# Patient Record
Sex: Male | Born: 1975 | Race: White | Hispanic: No | Smoking: Current every day smoker
Health system: Southern US, Community
[De-identification: ages and names within clinical notes are randomized; demographics above are authoritative.]

## PROBLEM LIST (undated history)

## (undated) DIAGNOSIS — K859 Acute pancreatitis without necrosis or infection, unspecified: Secondary | ICD-10-CM

## (undated) DIAGNOSIS — G8929 Other chronic pain: Secondary | ICD-10-CM

## (undated) DIAGNOSIS — I10 Essential (primary) hypertension: Secondary | ICD-10-CM

## (undated) DIAGNOSIS — M549 Dorsalgia, unspecified: Secondary | ICD-10-CM

## (undated) DIAGNOSIS — N289 Disorder of kidney and ureter, unspecified: Secondary | ICD-10-CM

## (undated) HISTORY — PX: FACIAL RECONSTRUCTION SURGERY: SHX631

## (undated) HISTORY — PX: BACK SURGERY: SHX140

---

## 2008-12-29 ENCOUNTER — Emergency Department (HOSPITAL_COMMUNITY): Admission: EM | Admit: 2008-12-29 | Discharge: 2008-12-29 | Payer: Self-pay | Admitting: Emergency Medicine

## 2009-01-01 ENCOUNTER — Emergency Department: Payer: Self-pay | Admitting: Emergency Medicine

## 2009-01-04 ENCOUNTER — Emergency Department (HOSPITAL_COMMUNITY): Admission: EM | Admit: 2009-01-04 | Discharge: 2009-01-05 | Payer: Self-pay | Admitting: Emergency Medicine

## 2009-02-04 ENCOUNTER — Emergency Department (HOSPITAL_COMMUNITY): Admission: EM | Admit: 2009-02-04 | Discharge: 2009-02-04 | Payer: Self-pay | Admitting: Emergency Medicine

## 2009-09-04 ENCOUNTER — Emergency Department: Payer: Self-pay | Admitting: Emergency Medicine

## 2009-12-02 ENCOUNTER — Emergency Department (HOSPITAL_COMMUNITY): Admission: EM | Admit: 2009-12-02 | Discharge: 2009-12-02 | Payer: Self-pay | Admitting: Emergency Medicine

## 2010-04-16 ENCOUNTER — Emergency Department (HOSPITAL_COMMUNITY)
Admission: EM | Admit: 2010-04-16 | Discharge: 2010-04-17 | Disposition: A | Discharge status: Home / Self Care | Payer: Medicare Other | Attending: Emergency Medicine | Admitting: Emergency Medicine

## 2010-04-16 ENCOUNTER — Emergency Department (HOSPITAL_COMMUNITY): Payer: Medicare Other

## 2010-04-16 DIAGNOSIS — R1032 Left lower quadrant pain: Secondary | ICD-10-CM | POA: Insufficient documentation

## 2010-04-16 DIAGNOSIS — R319 Hematuria, unspecified: Secondary | ICD-10-CM | POA: Insufficient documentation

## 2010-04-17 ENCOUNTER — Emergency Department (HOSPITAL_COMMUNITY)
Admission: EM | Admit: 2010-04-17 | Discharge: 2010-04-17 | Disposition: A | Discharge status: Home / Self Care | Payer: Medicare Other | Attending: Emergency Medicine | Admitting: Emergency Medicine

## 2010-04-17 ENCOUNTER — Emergency Department (HOSPITAL_COMMUNITY): Payer: Medicare Other

## 2010-04-17 DIAGNOSIS — R319 Hematuria, unspecified: Secondary | ICD-10-CM | POA: Insufficient documentation

## 2010-04-17 DIAGNOSIS — R109 Unspecified abdominal pain: Secondary | ICD-10-CM | POA: Insufficient documentation

## 2010-04-17 DIAGNOSIS — R112 Nausea with vomiting, unspecified: Secondary | ICD-10-CM | POA: Insufficient documentation

## 2010-04-17 DIAGNOSIS — M549 Dorsalgia, unspecified: Secondary | ICD-10-CM | POA: Insufficient documentation

## 2010-04-17 LAB — URINALYSIS, ROUTINE W REFLEX MICROSCOPIC
Bilirubin Urine: NEGATIVE
Bilirubin Urine: NEGATIVE
Hgb urine dipstick: NEGATIVE
Nitrite: NEGATIVE
Protein, ur: NEGATIVE mg/dL
Specific Gravity, Urine: 1.025 (ref 1.005–1.030)
Urine Glucose, Fasting: NEGATIVE mg/dL
Urobilinogen, UA: 0.2 mg/dL (ref 0.0–1.0)
Urobilinogen, UA: 0.2 mg/dL (ref 0.0–1.0)
pH: 6 (ref 5.0–8.0)

## 2010-04-17 LAB — POCT I-STAT, CHEM 8
Creatinine, Ser: 1.2 mg/dL (ref 0.4–1.5)
Glucose, Bld: 95 mg/dL (ref 70–99)
HCT: 54 % — ABNORMAL HIGH (ref 39.0–52.0)
Hemoglobin: 18.4 g/dL — ABNORMAL HIGH (ref 13.0–17.0)
Sodium: 135 mEq/L (ref 135–145)
TCO2: 19 mmol/L (ref 0–100)

## 2010-04-17 LAB — BASIC METABOLIC PANEL
CO2: 21 mEq/L (ref 19–32)
Calcium: 8.9 mg/dL (ref 8.4–10.5)
Chloride: 106 mEq/L (ref 96–112)
GFR calc Af Amer: >60 mL/min (ref >60)
Glucose, Bld: 113 mg/dL — ABNORMAL HIGH (ref 70–99)
Potassium: 3.7 mEq/L (ref 3.5–5.1)
Sodium: 137 mEq/L (ref 135–145)

## 2010-04-17 LAB — DIFFERENTIAL
Basophils Absolute: 0 10*3/uL (ref 0.0–0.1)
Basophils Relative: 0 % (ref 0–1)
Lymphocytes Relative: 14 % (ref 12–46)
Monocytes Absolute: 0.4 10*3/uL (ref 0.1–1.0)
Neutro Abs: 4.1 10*3/uL (ref 1.7–7.7)
Neutrophils Relative %: 78 % — ABNORMAL HIGH (ref 43–77)

## 2010-04-17 LAB — CBC
HCT: 44.6 % (ref 39.0–52.0)
Hemoglobin: 15.4 g/dL (ref 13.0–17.0)
RBC: 5.12 MIL/uL (ref 4.22–5.81)
WBC: 5.3 10*3/uL (ref 4.0–10.5)

## 2010-04-17 LAB — URINE MICROSCOPIC-ADD ON

## 2010-05-26 LAB — DIFFERENTIAL
Basophils Absolute: 0 10*3/uL (ref 0.0–0.1)
Basophils Relative: 0 % (ref 0–1)
Eosinophils Absolute: 0.1 10*3/uL (ref 0.0–0.7)
Monocytes Relative: 7 % (ref 3–12)
Neutro Abs: 5.7 10*3/uL (ref 1.7–7.7)
Neutrophils Relative %: 73 % (ref 43–77)

## 2010-05-26 LAB — CBC
MCHC: 34 g/dL (ref 30.0–36.0)
Platelets: 220 10*3/uL (ref 150–400)
RDW: 13.3 % (ref 11.5–15.5)

## 2010-05-26 LAB — BASIC METABOLIC PANEL
CO2: 29 mEq/L (ref 19–32)
Calcium: 9.1 mg/dL (ref 8.4–10.5)
Creatinine, Ser: 1.04 mg/dL (ref 0.4–1.5)
Glucose, Bld: 86 mg/dL (ref 70–99)

## 2010-05-27 LAB — COMPREHENSIVE METABOLIC PANEL
ALT: 19 U/L (ref 0–53)
AST: 19 U/L (ref 0–37)
Albumin: 4.1 g/dL (ref 3.5–5.2)
Calcium: 8.9 mg/dL (ref 8.4–10.5)
Creatinine, Ser: 0.92 mg/dL (ref 0.4–1.5)
GFR calc Af Amer: >60 mL/min (ref >60)
Sodium: 139 mEq/L (ref 135–145)

## 2010-05-27 LAB — DIFFERENTIAL
Eosinophils Absolute: 0.1 10*3/uL (ref 0.0–0.7)
Eosinophils Relative: 1 % (ref 0–5)
Eosinophils Relative: 2 % (ref 0–5)
Lymphocytes Relative: 18 % (ref 12–46)
Lymphs Abs: 1.8 10*3/uL (ref 0.7–4.0)
Lymphs Abs: 2.2 10*3/uL (ref 0.7–4.0)
Monocytes Absolute: 0.8 10*3/uL (ref 0.1–1.0)
Monocytes Relative: 7 % (ref 3–12)
Monocytes Relative: 9 % (ref 3–12)

## 2010-05-27 LAB — URINE MICROSCOPIC-ADD ON

## 2010-05-27 LAB — RAPID URINE DRUG SCREEN, HOSP PERFORMED
Amphetamines: NOT DETECTED
Barbiturates: NOT DETECTED
Benzodiazepines: POSITIVE — AB
Opiates: POSITIVE — AB
Tetrahydrocannabinol: NOT DETECTED

## 2010-05-27 LAB — URINALYSIS, ROUTINE W REFLEX MICROSCOPIC
Bilirubin Urine: NEGATIVE
Glucose, UA: NEGATIVE mg/dL
Hgb urine dipstick: NEGATIVE
Ketones, ur: NEGATIVE mg/dL
Nitrite: NEGATIVE
Protein, ur: NEGATIVE mg/dL
Specific Gravity, Urine: 1.015 (ref 1.005–1.030)
Urobilinogen, UA: 0.2 mg/dL (ref 0.0–1.0)
pH: 6 (ref 5.0–8.0)

## 2010-05-27 LAB — CBC
HCT: 40.2 % (ref 39.0–52.0)
MCHC: 34.3 g/dL (ref 30.0–36.0)
MCV: 89.7 fL (ref 78.0–100.0)
MCV: 89.7 fL (ref 78.0–100.0)
Platelets: 286 10*3/uL (ref 150–400)
Platelets: 368 10*3/uL (ref 150–400)
WBC: 10.3 10*3/uL (ref 4.0–10.5)
WBC: 7.9 10*3/uL (ref 4.0–10.5)

## 2010-05-27 LAB — BASIC METABOLIC PANEL
BUN: 10 mg/dL (ref 6–23)
CO2: 30 mEq/L (ref 19–32)
Chloride: 106 mEq/L (ref 96–112)
Potassium: 3.2 mEq/L — ABNORMAL LOW (ref 3.5–5.1)

## 2010-05-27 LAB — LIPASE, BLOOD: Lipase: 93 U/L — ABNORMAL HIGH (ref 11–59)

## 2010-08-17 ENCOUNTER — Emergency Department (HOSPITAL_COMMUNITY): Payer: Medicare Other

## 2010-08-17 ENCOUNTER — Emergency Department (HOSPITAL_COMMUNITY)
Admission: EM | Admit: 2010-08-17 | Discharge: 2010-08-17 | Disposition: A | Discharge status: Home / Self Care | Payer: Medicare Other | Attending: Emergency Medicine | Admitting: Emergency Medicine

## 2010-08-17 DIAGNOSIS — N200 Calculus of kidney: Secondary | ICD-10-CM | POA: Insufficient documentation

## 2010-08-17 DIAGNOSIS — R1031 Right lower quadrant pain: Secondary | ICD-10-CM | POA: Insufficient documentation

## 2010-08-17 DIAGNOSIS — Z79899 Other long term (current) drug therapy: Secondary | ICD-10-CM | POA: Insufficient documentation

## 2010-08-17 DIAGNOSIS — R630 Anorexia: Secondary | ICD-10-CM | POA: Insufficient documentation

## 2010-08-17 LAB — BASIC METABOLIC PANEL
Calcium: 9.7 mg/dL (ref 8.4–10.5)
Creatinine, Ser: 0.83 mg/dL (ref 0.50–1.35)
GFR calc Af Amer: >60 mL/min (ref >60)

## 2010-08-17 LAB — CBC
MCH: 29.3 pg (ref 26.0–34.0)
MCHC: 33.7 g/dL (ref 30.0–36.0)
MCV: 87 fL (ref 78.0–100.0)
Platelets: 230 10*3/uL (ref 150–400)
RDW: 13.4 % (ref 11.5–15.5)

## 2010-08-17 LAB — URINALYSIS, ROUTINE W REFLEX MICROSCOPIC
Bilirubin Urine: NEGATIVE
Nitrite: NEGATIVE
Protein, ur: NEGATIVE mg/dL
Specific Gravity, Urine: 1.025 (ref 1.005–1.030)
Urobilinogen, UA: 0.2 mg/dL (ref 0.0–1.0)

## 2010-08-17 LAB — DIFFERENTIAL
Eosinophils Absolute: 0 10*3/uL (ref 0.0–0.7)
Eosinophils Relative: 0 % (ref 0–5)
Lymphs Abs: 0.7 10*3/uL (ref 0.7–4.0)
Monocytes Relative: 9 % (ref 3–12)

## 2010-08-17 LAB — HEPATIC FUNCTION PANEL
Alkaline Phosphatase: 87 U/L (ref 39–117)
Indirect Bilirubin: 0.2 mg/dL — ABNORMAL LOW (ref 0.3–0.9)
Total Protein: 7.3 g/dL (ref 6.0–8.3)

## 2010-08-17 LAB — LIPASE, BLOOD: Lipase: 54 U/L (ref 11–59)

## 2010-08-17 MED ORDER — IOHEXOL 300 MG/ML  SOLN
100.0000 mL | Freq: Once | INTRAMUSCULAR | Status: AC | PRN
  Administered 2010-08-17: 100 mL via INTRAVENOUS

## 2012-07-20 ENCOUNTER — Emergency Department (HOSPITAL_COMMUNITY)
Admission: EM | Admit: 2012-07-20 | Discharge: 2012-07-20 | Disposition: A | Discharge status: Home / Self Care | Payer: Medicare Other | Attending: Emergency Medicine | Admitting: Emergency Medicine

## 2012-07-20 ENCOUNTER — Encounter (HOSPITAL_COMMUNITY): Payer: Self-pay | Admitting: *Deleted

## 2012-07-20 DIAGNOSIS — M549 Dorsalgia, unspecified: Secondary | ICD-10-CM | POA: Insufficient documentation

## 2012-07-20 DIAGNOSIS — R112 Nausea with vomiting, unspecified: Secondary | ICD-10-CM | POA: Insufficient documentation

## 2012-07-20 DIAGNOSIS — I10 Essential (primary) hypertension: Secondary | ICD-10-CM | POA: Insufficient documentation

## 2012-07-20 DIAGNOSIS — N200 Calculus of kidney: Secondary | ICD-10-CM | POA: Diagnosis present

## 2012-07-20 DIAGNOSIS — Z79899 Other long term (current) drug therapy: Secondary | ICD-10-CM | POA: Insufficient documentation

## 2012-07-20 DIAGNOSIS — R109 Unspecified abdominal pain: Secondary | ICD-10-CM | POA: Insufficient documentation

## 2012-07-20 DIAGNOSIS — R319 Hematuria, unspecified: Secondary | ICD-10-CM | POA: Insufficient documentation

## 2012-07-20 DIAGNOSIS — Z8719 Personal history of other diseases of the digestive system: Secondary | ICD-10-CM | POA: Insufficient documentation

## 2012-07-20 DIAGNOSIS — R197 Diarrhea, unspecified: Secondary | ICD-10-CM | POA: Insufficient documentation

## 2012-07-20 DIAGNOSIS — Z87442 Personal history of urinary calculi: Secondary | ICD-10-CM | POA: Insufficient documentation

## 2012-07-20 DIAGNOSIS — G8929 Other chronic pain: Secondary | ICD-10-CM | POA: Insufficient documentation

## 2012-07-20 DIAGNOSIS — Z87828 Personal history of other (healed) physical injury and trauma: Secondary | ICD-10-CM | POA: Insufficient documentation

## 2012-07-20 DIAGNOSIS — R34 Anuria and oliguria: Secondary | ICD-10-CM | POA: Insufficient documentation

## 2012-07-20 DIAGNOSIS — R61 Generalized hyperhidrosis: Secondary | ICD-10-CM | POA: Insufficient documentation

## 2012-07-20 HISTORY — DX: Essential (primary) hypertension: I10

## 2012-07-20 HISTORY — DX: Disorder of kidney and ureter, unspecified: N28.9

## 2012-07-20 HISTORY — DX: Acute pancreatitis without necrosis or infection, unspecified: K85.90

## 2012-07-20 HISTORY — DX: Other chronic pain: G89.29

## 2012-07-20 HISTORY — DX: Dorsalgia, unspecified: M54.9

## 2012-07-20 LAB — CBC
MCH: 30.4 pg (ref 26.0–34.0)
MCHC: 34.9 g/dL (ref 30.0–36.0)
MCV: 87.1 fL (ref 78.0–100.0)
Platelets: 343 10*3/uL (ref 150–400)
RDW: 14.4 % (ref 11.5–15.5)
WBC: 6.4 10*3/uL (ref 4.0–10.5)

## 2012-07-20 LAB — COMPREHENSIVE METABOLIC PANEL
ALT: 22 U/L (ref 0–53)
CO2: 27 mEq/L (ref 19–32)
Calcium: 9.9 mg/dL (ref 8.4–10.5)
Chloride: 103 mEq/L (ref 96–112)
GFR calc Af Amer: >90 mL/min (ref >90)
GFR calc non Af Amer: >90 mL/min (ref >90)
Glucose, Bld: 92 mg/dL (ref 70–99)
Sodium: 141 mEq/L (ref 135–145)
Total Bilirubin: 0.6 mg/dL (ref 0.3–1.2)

## 2012-07-20 LAB — URINALYSIS, ROUTINE W REFLEX MICROSCOPIC
Ketones, ur: NEGATIVE mg/dL
Protein, ur: NEGATIVE mg/dL
Urobilinogen, UA: 0.2 mg/dL (ref 0.0–1.0)

## 2012-07-20 LAB — URINE MICROSCOPIC-ADD ON

## 2012-07-20 MED ORDER — SODIUM CHLORIDE 0.9 % IV BOLUS (SEPSIS)
1000.0000 mL | Freq: Once | INTRAVENOUS | Status: AC
  Administered 2012-07-20: 1000 mL via INTRAVENOUS

## 2012-07-20 MED ORDER — ONDANSETRON 4 MG PO TBDP: 4.0000 mg | ORAL_TABLET | Freq: Three times a day (TID) | ORAL | Status: DC | PRN

## 2012-07-20 MED ORDER — ONDANSETRON HCL 4 MG/2ML IJ SOLN: 4.0000 mg | Freq: Once | INTRAMUSCULAR | Status: DC

## 2012-07-20 MED ORDER — NAPROXEN SODIUM 220 MG PO TABS: 440.0000 mg | ORAL_TABLET | Freq: Two times a day (BID) | ORAL | Status: DC

## 2012-07-20 MED ORDER — PROMETHAZINE HCL 25 MG/ML IJ SOLN
12.5000 mg | Freq: Once | INTRAMUSCULAR | Status: AC
  Administered 2012-07-20: 12.5 mg via INTRAVENOUS
  Filled 2012-07-20: qty 1

## 2012-07-20 MED ORDER — HYDROMORPHONE HCL PF 1 MG/ML IJ SOLN
1.0000 mg | Freq: Once | INTRAMUSCULAR | Status: AC
  Administered 2012-07-20: 1 mg via INTRAVENOUS
  Filled 2012-07-20: qty 1

## 2012-07-20 MED ORDER — DIPHENHYDRAMINE HCL 50 MG/ML IJ SOLN
12.5000 mg | Freq: Once | INTRAMUSCULAR | Status: AC
  Administered 2012-07-20: 12.5 mg via INTRAVENOUS
  Filled 2012-07-20: qty 1

## 2012-07-20 NOTE — ED Notes (Signed)
Left flank pain radiating to back with dark urine and burning with urination, n/v since las night.

## 2012-07-20 NOTE — ED Notes (Signed)
Patient able to urinate in urinal. Refused in and out.
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
## 2012-07-21 NOTE — ED Provider Notes (Addendum)
I have personally seen and examined the patient.  I have discussed the plan of care with the resident.  I have reviewed the documentation on PMH/FH/Soc. History.  I have reviewed the documentation of the resident and agree.  Pt well appearing, no distress on my evaluation, abdomen soft without focal tenderness.  Stable for d/c [facility] [place] reviewed  Joya Gaskins, MD 07/21/12 [phone]  Please note that patient is 37 year old, not 37 year old  Joya Gaskins, MD 07/21/12 [phone]

## 2012-07-30 ENCOUNTER — Encounter (HOSPITAL_BASED_OUTPATIENT_CLINIC_OR_DEPARTMENT_OTHER): Payer: Self-pay

## 2013-11-28 ENCOUNTER — Emergency Department (HOSPITAL_COMMUNITY)
Admission: EM | Admit: 2013-11-28 | Discharge: 2013-11-28 | Disposition: A | Discharge status: Home / Self Care | Payer: Medicare Other | Attending: Emergency Medicine | Admitting: Emergency Medicine

## 2013-11-28 ENCOUNTER — Emergency Department (HOSPITAL_COMMUNITY): Payer: Medicare Other

## 2013-11-28 ENCOUNTER — Encounter (HOSPITAL_COMMUNITY): Payer: Self-pay | Admitting: Emergency Medicine

## 2013-11-28 DIAGNOSIS — R062 Wheezing: Secondary | ICD-10-CM | POA: Insufficient documentation

## 2013-11-28 DIAGNOSIS — M549 Dorsalgia, unspecified: Secondary | ICD-10-CM | POA: Diagnosis not present

## 2013-11-28 DIAGNOSIS — G8929 Other chronic pain: Secondary | ICD-10-CM | POA: Insufficient documentation

## 2013-11-28 DIAGNOSIS — Z79899 Other long term (current) drug therapy: Secondary | ICD-10-CM | POA: Insufficient documentation

## 2013-11-28 DIAGNOSIS — R319 Hematuria, unspecified: Secondary | ICD-10-CM | POA: Insufficient documentation

## 2013-11-28 DIAGNOSIS — Z87828 Personal history of other (healed) physical injury and trauma: Secondary | ICD-10-CM | POA: Diagnosis not present

## 2013-11-28 DIAGNOSIS — R109 Unspecified abdominal pain: Secondary | ICD-10-CM

## 2013-11-28 DIAGNOSIS — I10 Essential (primary) hypertension: Secondary | ICD-10-CM | POA: Insufficient documentation

## 2013-11-28 DIAGNOSIS — Z87448 Personal history of other diseases of urinary system: Secondary | ICD-10-CM | POA: Insufficient documentation

## 2013-11-28 DIAGNOSIS — R339 Retention of urine, unspecified: Secondary | ICD-10-CM | POA: Diagnosis present

## 2013-11-28 LAB — CBC WITH DIFFERENTIAL/PLATELET
BASOS ABS: 0 10*3/uL (ref 0.0–0.1)
BASOS PCT: 0 % (ref 0–1)
EOS ABS: 0.1 10*3/uL (ref 0.0–0.7)
Eosinophils Relative: 2 % (ref 0–5)
HCT: 40.4 % (ref 39.0–52.0)
Hemoglobin: 13.8 g/dL (ref 13.0–17.0)
Lymphocytes Relative: 45 % (ref 12–46)
Lymphs Abs: 3.1 10*3/uL (ref 0.7–4.0)
MCH: 30.3 pg (ref 26.0–34.0)
MCHC: 34.2 g/dL (ref 30.0–36.0)
MCV: 88.8 fL (ref 78.0–100.0)
Monocytes Absolute: 0.4 10*3/uL (ref 0.1–1.0)
Monocytes Relative: 6 % (ref 3–12)
NEUTROS PCT: 47 % (ref 43–77)
Neutro Abs: 3.2 10*3/uL (ref 1.7–7.7)
PLATELETS: 320 10*3/uL (ref 150–400)
RBC: 4.55 MIL/uL (ref 4.22–5.81)
RDW: 13.5 % (ref 11.5–15.5)
WBC: 6.8 10*3/uL (ref 4.0–10.5)

## 2013-11-28 LAB — BASIC METABOLIC PANEL
ANION GAP: 15 (ref 5–15)
BUN: 9 mg/dL (ref 6–23)
CO2: 25 mEq/L (ref 19–32)
Calcium: 9.1 mg/dL (ref 8.4–10.5)
Chloride: 103 mEq/L (ref 96–112)
Creatinine, Ser: 1.16 mg/dL (ref 0.50–1.35)
GFR, EST NON AFRICAN AMERICAN: 78 mL/min — AB (ref >90)
Glucose, Bld: 89 mg/dL (ref 70–99)
POTASSIUM: 3.6 meq/L — AB (ref 3.7–5.3)
SODIUM: 143 meq/L (ref 137–147)

## 2013-11-28 LAB — URINALYSIS, ROUTINE W REFLEX MICROSCOPIC
BILIRUBIN URINE: NEGATIVE
Glucose, UA: NEGATIVE mg/dL
KETONES UR: NEGATIVE mg/dL
LEUKOCYTES UA: NEGATIVE
NITRITE: NEGATIVE
Protein, ur: NEGATIVE mg/dL
Specific Gravity, Urine: 1.01 (ref 1.005–1.030)
UROBILINOGEN UA: 0.2 mg/dL (ref 0.0–1.0)
pH: 6 (ref 5.0–8.0)

## 2013-11-28 LAB — URINE MICROSCOPIC-ADD ON

## 2013-11-28 MED ORDER — NAPROXEN 500 MG PO TABS: 500.0000 mg | ORAL_TABLET | Freq: Two times a day (BID) | ORAL | Status: DC

## 2013-11-28 MED ORDER — TRAMADOL HCL 50 MG PO TABS: 50.0000 mg | ORAL_TABLET | Freq: Four times a day (QID) | ORAL | Status: DC | PRN

## 2013-11-28 MED ORDER — SODIUM CHLORIDE 0.9 % IV SOLN: INTRAVENOUS | Status: DC

## 2013-11-28 MED ORDER — CEPHALEXIN 500 MG PO CAPS: 500.0000 mg | ORAL_CAPSULE | Freq: Four times a day (QID) | ORAL | Status: DC

## 2013-11-28 MED ORDER — PROMETHAZINE HCL 25 MG PO TABS: 25.0000 mg | ORAL_TABLET | Freq: Four times a day (QID) | ORAL | Status: DC | PRN

## 2013-11-28 MED ORDER — HYDROMORPHONE HCL 1 MG/ML IJ SOLN
1.0000 mg | Freq: Once | INTRAMUSCULAR | Status: AC
  Administered 2013-11-28: 1 mg via INTRAVENOUS
  Filled 2013-11-28: qty 1

## 2013-11-28 MED ORDER — ONDANSETRON HCL 4 MG/2ML IJ SOLN
4.0000 mg | Freq: Once | INTRAMUSCULAR | Status: AC
  Administered 2013-11-28: 4 mg via INTRAVENOUS
  Filled 2013-11-28: qty 2

## 2013-11-28 MED ORDER — SODIUM CHLORIDE 0.9 % IV BOLUS (SEPSIS)
1000.0000 mL | Freq: Once | INTRAVENOUS | Status: AC
  Administered 2013-11-28: 1000 mL via INTRAVENOUS
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
## 2013-11-28 NOTE — ED Notes (Signed)
Doubled over, holding left groin, states pain 10/10 and nausea persists after taking phenergan @ home. States he's passed 3 stones @ home and now is having persistent pain and scrotal swelling

## 2013-11-28 NOTE — ED Notes (Signed)
Complain of pain in left flank area. States he has to sit down to pee.History of stones

## 2013-11-28 NOTE — Discharge Instructions (Signed)
Workup for the flank pain without good explanation based on CT scan. Certainly has hematuria but no evidence of any ureteral stones. Urine culture sent we'll treat as possible infection. Take antibiotic as directed. Take antinausea medicine as needed. Take pain medicine as needed. Followup with your doctor in the next few days. Return for a newer worse symptoms.

## 2013-11-29 LAB — URINE CULTURE
COLONY COUNT: NO GROWTH
CULTURE: NO GROWTH

## 2014-03-15 ENCOUNTER — Ambulatory Visit: Payer: Medicare Other | Admitting: Urology

## 2014-06-03 ENCOUNTER — Emergency Department: Admit: 2014-06-03 | Disposition: A | Discharge status: Home / Self Care | Payer: Self-pay | Admitting: Student

## 2014-06-03 LAB — COMPREHENSIVE METABOLIC PANEL
ALK PHOS: 74 U/L
AST: 19 U/L
Albumin: 5 g/dL
Anion Gap: 8 (ref 7–16)
BUN: 9 mg/dL
Bilirubin,Total: 0.6 mg/dL
CALCIUM: 9.6 mg/dL
CO2: 30 mmol/L
Chloride: 106 mmol/L
Creatinine: 1.09 mg/dL
EGFR (African American): >60
EGFR (Non-African Amer.): >60
Glucose: 80 mg/dL
POTASSIUM: 3.9 mmol/L
SGPT (ALT): 16 U/L — ABNORMAL LOW
Sodium: 144 mmol/L
Total Protein: 8.2 g/dL — ABNORMAL HIGH

## 2014-06-03 LAB — DRUG SCREEN, URINE
Amphetamines, Ur Screen: NEGATIVE
BARBITURATES, UR SCREEN: NEGATIVE
Benzodiazepine, Ur Scrn: NEGATIVE
CANNABINOID 50 NG, UR ~~LOC~~: POSITIVE
COCAINE METABOLITE, UR ~~LOC~~: POSITIVE
MDMA (ECSTASY) UR SCREEN: NEGATIVE
Methadone, Ur Screen: NEGATIVE
Opiate, Ur Screen: NEGATIVE
Phencyclidine (PCP) Ur S: NEGATIVE
Tricyclic, Ur Screen: NEGATIVE

## 2014-06-03 LAB — CBC
HCT: 46.2 % (ref 40.0–52.0)
HGB: 15.6 g/dL (ref 13.0–18.0)
MCH: 30 pg (ref 26.0–34.0)
MCHC: 33.8 g/dL (ref 32.0–36.0)
MCV: 89 fL (ref 80–100)
Platelet: 295 10*3/uL (ref 150–440)
RBC: 5.21 10*6/uL (ref 4.40–5.90)
RDW: 14.3 % (ref 11.5–14.5)
WBC: 6.3 10*3/uL (ref 3.8–10.6)

## 2014-06-03 LAB — URINALYSIS, COMPLETE
Bacteria: NONE SEEN
Bilirubin,UR: NEGATIVE
Blood: NEGATIVE
GLUCOSE, UR: NEGATIVE mg/dL (ref 0–75)
Ketone: NEGATIVE
LEUKOCYTE ESTERASE: NEGATIVE
NITRITE: NEGATIVE
PROTEIN: NEGATIVE
Ph: 6 (ref 4.5–8.0)
Specific Gravity: 1.018 (ref 1.003–1.030)
Squamous Epithelial: NONE SEEN

## 2014-06-03 LAB — ETHANOL

## 2015-06-02 ENCOUNTER — Encounter: Payer: Self-pay | Admitting: Urology

## 2015-06-02 ENCOUNTER — Ambulatory Visit: Payer: Self-pay | Admitting: Urology

## 2015-07-30 ENCOUNTER — Ambulatory Visit: Payer: Medicare Other | Attending: Anesthesiology | Admitting: Anesthesiology

## 2015-07-30 ENCOUNTER — Encounter: Payer: Self-pay | Admitting: Anesthesiology

## 2015-07-30 ENCOUNTER — Ambulatory Visit: Payer: Medicare Other | Admitting: Anesthesiology

## 2015-07-30 VITALS — BP 196/125 | HR 48 | Temp 97.6°F | Resp 16 | Ht 72.0 in | Wt 210.0 lb

## 2015-07-30 DIAGNOSIS — Z87442 Personal history of urinary calculi: Secondary | ICD-10-CM | POA: Insufficient documentation

## 2015-07-30 DIAGNOSIS — M542 Cervicalgia: Secondary | ICD-10-CM | POA: Diagnosis present

## 2015-07-30 DIAGNOSIS — F419 Anxiety disorder, unspecified: Secondary | ICD-10-CM | POA: Insufficient documentation

## 2015-07-30 DIAGNOSIS — M5136 Other intervertebral disc degeneration, lumbar region: Secondary | ICD-10-CM | POA: Diagnosis not present

## 2015-07-30 DIAGNOSIS — M549 Dorsalgia, unspecified: Secondary | ICD-10-CM | POA: Diagnosis present

## 2015-07-30 DIAGNOSIS — M47817 Spondylosis without myelopathy or radiculopathy, lumbosacral region: Secondary | ICD-10-CM | POA: Diagnosis not present

## 2015-07-30 DIAGNOSIS — M25569 Pain in unspecified knee: Secondary | ICD-10-CM | POA: Diagnosis present

## 2015-07-30 DIAGNOSIS — I151 Hypertension secondary to other renal disorders: Secondary | ICD-10-CM | POA: Insufficient documentation

## 2015-07-30 DIAGNOSIS — G8929 Other chronic pain: Secondary | ICD-10-CM

## 2015-07-30 DIAGNOSIS — R5383 Other fatigue: Secondary | ICD-10-CM | POA: Diagnosis not present

## 2015-07-30 DIAGNOSIS — F17210 Nicotine dependence, cigarettes, uncomplicated: Secondary | ICD-10-CM | POA: Insufficient documentation
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
## 2015-08-04 ENCOUNTER — Other Ambulatory Visit: Payer: Self-pay

## 2015-08-08 LAB — TOXASSURE SELECT 13 (MW), URINE: PDF: 0

## 2015-09-03 ENCOUNTER — Ambulatory Visit: Payer: Medicare Other | Admitting: Anesthesiology

## 2015-09-04 ENCOUNTER — Telehealth: Payer: Self-pay

## 2015-09-04 NOTE — Telephone Encounter (Signed)
Spoke with patient, he would like to keep his appt for the procedure on July 18 and would like to cancel appt for MRI on Saturday and reschedule for a later date.

## 2015-09-04 NOTE — Telephone Encounter (Signed)
Pt wants to know does he still have to come in for appt on procedure even though he has not had his MRI. Pt had an MRI scheduled for Saturday but wants me to cancel it because he does not have a ride.

## 2015-09-06 ENCOUNTER — Ambulatory Visit: Payer: Medicare Other

## 2015-09-09 ENCOUNTER — Emergency Department
Admission: EM | Admit: 2015-09-09 | Discharge: 2015-09-09 | Disposition: A | Discharge status: Home / Self Care | Payer: Medicare Other | Attending: Emergency Medicine | Admitting: Emergency Medicine

## 2015-09-09 ENCOUNTER — Encounter: Payer: Self-pay | Admitting: Anesthesiology

## 2015-09-09 ENCOUNTER — Ambulatory Visit (HOSPITAL_BASED_OUTPATIENT_CLINIC_OR_DEPARTMENT_OTHER): Payer: Medicare Other | Admitting: Anesthesiology

## 2015-09-09 DIAGNOSIS — F17210 Nicotine dependence, cigarettes, uncomplicated: Secondary | ICD-10-CM

## 2015-09-09 DIAGNOSIS — M545 Low back pain: Secondary | ICD-10-CM

## 2015-09-09 DIAGNOSIS — M5136 Other intervertebral disc degeneration, lumbar region: Secondary | ICD-10-CM | POA: Insufficient documentation

## 2015-09-09 DIAGNOSIS — Z87442 Personal history of urinary calculi: Secondary | ICD-10-CM | POA: Insufficient documentation

## 2015-09-09 DIAGNOSIS — K859 Acute pancreatitis without necrosis or infection, unspecified: Secondary | ICD-10-CM

## 2015-09-09 DIAGNOSIS — N200 Calculus of kidney: Secondary | ICD-10-CM | POA: Diagnosis not present

## 2015-09-09 DIAGNOSIS — F419 Anxiety disorder, unspecified: Secondary | ICD-10-CM | POA: Insufficient documentation

## 2015-09-09 DIAGNOSIS — I151 Hypertension secondary to other renal disorders: Secondary | ICD-10-CM

## 2015-09-09 DIAGNOSIS — G8929 Other chronic pain: Secondary | ICD-10-CM

## 2015-09-09 DIAGNOSIS — M47817 Spondylosis without myelopathy or radiculopathy, lumbosacral region: Secondary | ICD-10-CM | POA: Diagnosis not present

## 2015-09-09 DIAGNOSIS — I10 Essential (primary) hypertension: Secondary | ICD-10-CM | POA: Insufficient documentation

## 2015-09-09 DIAGNOSIS — Z79899 Other long term (current) drug therapy: Secondary | ICD-10-CM | POA: Diagnosis not present

## 2015-09-09 DIAGNOSIS — R202 Paresthesia of skin: Secondary | ICD-10-CM | POA: Insufficient documentation

## 2015-09-09 DIAGNOSIS — R1030 Lower abdominal pain, unspecified: Secondary | ICD-10-CM | POA: Diagnosis present

## 2015-09-09 LAB — BASIC METABOLIC PANEL
Anion gap: 4 — ABNORMAL LOW (ref 5–15)
BUN: 7 mg/dL (ref 6–20)
CALCIUM: 9 mg/dL (ref 8.9–10.3)
CO2: 28 mmol/L (ref 22–32)
Chloride: 108 mmol/L (ref 101–111)
Creatinine, Ser: 0.89 mg/dL (ref 0.61–1.24)
GFR calc Af Amer: >60 mL/min (ref >60)
GFR calc non Af Amer: >60 mL/min (ref >60)
GLUCOSE: 90 mg/dL (ref 65–99)
Potassium: 3.8 mmol/L (ref 3.5–5.1)
Sodium: 140 mmol/L (ref 135–145)

## 2015-09-09 LAB — CBC
HCT: 40.1 % (ref 40.0–52.0)
Hemoglobin: 13.9 g/dL (ref 13.0–18.0)
MCH: 30.5 pg (ref 26.0–34.0)
MCHC: 34.6 g/dL (ref 32.0–36.0)
MCV: 88 fL (ref 80.0–100.0)
PLATELETS: 292 10*3/uL (ref 150–440)
RBC: 4.55 MIL/uL (ref 4.40–5.90)
RDW: 14.1 % (ref 11.5–14.5)
WBC: 6.2 10*3/uL (ref 3.8–10.6)

## 2015-09-09 LAB — URINALYSIS COMPLETE WITH MICROSCOPIC (ARMC ONLY)
BILIRUBIN URINE: NEGATIVE
Bacteria, UA: NONE SEEN
Glucose, UA: NEGATIVE mg/dL
KETONES UR: NEGATIVE mg/dL
Leukocytes, UA: NEGATIVE
NITRITE: NEGATIVE
PH: 5 (ref 5.0–8.0)
Protein, ur: NEGATIVE mg/dL
SPECIFIC GRAVITY, URINE: 1.014 (ref 1.005–1.030)
SQUAMOUS EPITHELIAL / LPF: NONE SEEN

## 2015-09-09 MED ORDER — OXYCODONE-ACETAMINOPHEN 5-325 MG PO TABS
1.0000 | ORAL_TABLET | Freq: Once | ORAL | Status: AC
  Administered 2015-09-09: 1 via ORAL

## 2015-09-09 MED ORDER — SODIUM CHLORIDE 0.9 % IV BOLUS (SEPSIS)
1000.0000 mL | Freq: Once | INTRAVENOUS | Status: AC
  Administered 2015-09-09: 1000 mL via INTRAVENOUS

## 2015-09-09 MED ORDER — KETOROLAC TROMETHAMINE 30 MG/ML IJ SOLN
30.0000 mg | Freq: Once | INTRAMUSCULAR | Status: AC
  Administered 2015-09-09: 30 mg via INTRAVENOUS
  Filled 2015-09-09: qty 1

## 2015-09-09 MED ORDER — TAMSULOSIN HCL 0.4 MG PO CAPS: 0.4000 mg | ORAL_CAPSULE | Freq: Every day | ORAL | Status: DC

## 2015-09-09 MED ORDER — ONDANSETRON HCL 4 MG PO TABS: 4.0000 mg | ORAL_TABLET | Freq: Three times a day (TID) | ORAL | Status: DC | PRN

## 2015-09-09 MED ORDER — KETOROLAC TROMETHAMINE 10 MG PO TABS: 10.0000 mg | ORAL_TABLET | Freq: Three times a day (TID) | ORAL | Status: DC | PRN

## 2015-09-09 MED ORDER — OXYCODONE-ACETAMINOPHEN 5-325 MG PO TABS
ORAL_TABLET | ORAL | Status: AC
  Administered 2015-09-09: 1 via ORAL
  Filled 2015-09-09: qty 1

## 2015-09-09 NOTE — Patient Instructions (Signed)
Pain Management Discharge Instructions  General Discharge Instructions :  If you need to reach your doctor call: Monday-Friday 8:00 am - 4:00 pm at [phone] or toll free [phone].  After clinic hours [phone] to have operator reach doctor.  Bring all of your medication bottles to all your appointments in the pain clinic.  To cancel or reschedule your appointment with Pain Management please remember to call 24 hours in advance to avoid a fee.  Refer to the educational materials which you have been given on: General Risks, I had my Procedure. Discharge Instructions, Post Sedation.  Post Procedure Instructions:  The drugs you were given will stay in your system until tomorrow, so for the next 24 hours you should not drive, make any legal decisions or drink any alcoholic beverages.  You may eat anything you prefer, but it is better to start with liquids then soups and crackers, and gradually work up to solid foods.  Please notify your doctor immediately if you have any unusual bleeding, trouble breathing or pain that is not related to your normal pain.  Depending on the type of procedure that was done, some parts of your body may feel week and/or numb.  This usually clears up by tonight or the next day.  Walk with the use of an assistive device or accompanied by an adult for the 24 hours.  You may use ice on the affected area for the first 24 hours.  Put ice in a Ziploc bag and cover with a towel and place against area 15 minutes on 15 minutes off.  You may switch to heat after 24 hours.GENERAL RISKS AND COMPLICATIONS  What are the risk, side effects and possible complications? Generally speaking, most procedures are safe.  However, with any procedure there are risks, side effects, and the possibility of complications.  The risks and complications are dependent upon the sites that are lesioned, or the type of nerve block to be performed.  The closer the procedure is to the spine,  the more serious the risks are.  Great care is taken when placing the radio frequency needles, block needles or lesioning probes, but sometimes complications can occur. 1. Infection: Any time there is an injection through the skin, there is a risk of infection.  This is why sterile conditions are used for these blocks.  There are four possible types of infection. 1. Localized skin infection. 2. Central Nervous System Infection-This can be in the form of Meningitis, which can be deadly. 3. Epidural Infections-This can be in the form of an epidural abscess, which can cause pressure inside of the spine, causing compression of the spinal cord with subsequent paralysis. This would require an emergency surgery to decompress, and there are no guarantees that the patient would recover from the paralysis. 4. Discitis-This is an infection of the intervertebral discs.  It occurs in about 1% of discography procedures.  It is difficult to treat and it may lead to surgery.        2. Pain: the needles have to go through skin and soft tissues, will cause soreness.       3. Damage to internal structures:  The nerves to be lesioned may be near blood vessels or    other nerves which can be potentially damaged.       4. Bleeding: Bleeding is more common if the patient is taking blood thinners such as  aspirin, Coumadin, Ticiid, Plavix, etc., or if he/she have some genetic predisposition  such as   hemophilia. Bleeding into the spinal canal can cause compression of the spinal  cord with subsequent paralysis.  This would require an emergency surgery to  decompress and there are no guarantees that the patient would recover from the  paralysis.       5. Pneumothorax:  Puncturing of a lung is a possibility, every time a needle is introduced in  the area of the chest or upper back.  Pneumothorax refers to free air around the  collapsed lung(s), inside of the thoracic cavity (chest cavity).  Another two possible  complications  related to a similar event would include: Hemothorax and Chylothorax.   These are variations of the Pneumothorax, where instead of air around the collapsed  lung(s), you may have blood or chyle, respectively.       6. Spinal headaches: They may occur with any procedures in the area of the spine.       7. Persistent CSF (Cerebro-Spinal Fluid) leakage: This is a rare problem, but may occur  with prolonged intrathecal or epidural catheters either due to the formation of a fistulous  track or a dural tear.       8. Nerve damage: By working so close to the spinal cord, there is always a possibility of  nerve damage, which could be as serious as a permanent spinal cord injury with  paralysis.       9. Death:  Although rare, severe deadly allergic reactions known as "Anaphylactic  reaction" can occur to any of the medications used.      10. Worsening of the symptoms:  We can always make thing worse.  What are the chances of something like this happening? Chances of any of this occuring are extremely low.  By statistics, you have more of a chance of getting killed in a motor vehicle accident: while driving to the hospital than any of the above occurring .  Nevertheless, you should be aware that they are possibilities.  In general, it is similar to taking a shower.  Everybody knows that you can slip, hit your head and get killed.  Does that mean that you should not shower again?  Nevertheless always keep in mind that statistics do not mean anything if you happen to be on the wrong side of them.  Even if a procedure has a 1 (one) in a 1,000,000 (million) chance of going wrong, it you happen to be that one..Also, keep in mind that by statistics, you have more of a chance of having something go wrong when taking medications.  Who should not have this procedure? If you are on a blood thinning medication (e.g. Coumadin, Plavix, see list of "Blood Thinners"), or if you have an active infection going on, you should not  have the procedure.  If you are taking any blood thinners, please inform your physician.  How should I prepare for this procedure?  Do not eat or drink anything at least six hours prior to the procedure.  Bring a driver with you .  It cannot be a taxi.  Come accompanied by an adult that can drive you back, and that is strong enough to help you if your legs get weak or numb from the local anesthetic.  Take all of your medicines the morning of the procedure with just enough water to swallow them.  If you have diabetes, make sure that you are scheduled to have your procedure done first thing in the morning, whenever possible.  If you have diabetes,   take only half of your insulin dose and notify our nurse that you have done so as soon as you arrive at the clinic.  If you are diabetic, but only take blood sugar pills (oral hypoglycemic), then do not take them on the morning of your procedure.  You may take them after you have had the procedure.  Do not take aspirin or any aspirin-containing medications, at least eleven (11) days prior to the procedure.  They may prolong bleeding.  Wear loose fitting clothing that may be easy to take off and that you would not mind if it got stained with Betadine or blood.  Do not wear any jewelry or perfume  Remove any nail coloring.  It will interfere with some of our monitoring equipment.  NOTE: Remember that this is not meant to be interpreted as a complete list of all possible complications.  Unforeseen problems may occur.  BLOOD THINNERS The following drugs contain aspirin or other products, which can cause increased bleeding during surgery and should not be taken for 2 weeks prior to and 1 week after surgery.  If you should need take something for relief of minor pain, you may take acetaminophen which is found in Tylenol,m Datril, Anacin-3 and Panadol. It is not blood thinner. The products listed below are.  Do not take any of the products listed below  in addition to any listed on your instruction sheet.  A.P.C or A.P.C with Codeine Codeine Phosphate Capsules #3 Ibuprofen Ridaura  ABC compound Congesprin Imuran rimadil  Advil Cope Indocin Robaxisal  Alka-Seltzer Effervescent Pain Reliever and Antacid Coricidin or Coricidin-D  Indomethacin Rufen  Alka-Seltzer plus Cold Medicine Cosprin Ketoprofen S-A-C Tablets  Anacin Analgesic Tablets or Capsules Coumadin Korlgesic Salflex  Anacin Extra Strength Analgesic tablets or capsules CP-2 Tablets Lanoril Salicylate  Anaprox Cuprimine Capsules Levenox Salocol  Anexsia-D Dalteparin Magan Salsalate  Anodynos Darvon compound Magnesium Salicylate Sine-off  Ansaid Dasin Capsules Magsal Sodium Salicylate  Anturane Depen Capsules Marnal Soma  APF Arthritis pain formula Dewitt's Pills Measurin Stanback  Argesic Dia-Gesic Meclofenamic Sulfinpyrazone  Arthritis Bayer Timed Release Aspirin Diclofenac Meclomen Sulindac  Arthritis pain formula Anacin Dicumarol Medipren Supac  Analgesic (Safety coated) Arthralgen Diffunasal Mefanamic Suprofen  Arthritis Strength Bufferin Dihydrocodeine Mepro Compound Suprol  Arthropan liquid Dopirydamole Methcarbomol with Aspirin Synalgos  ASA tablets/Enseals Disalcid Micrainin Tagament  Ascriptin Doan's Midol Talwin  Ascriptin A/D Dolene Mobidin Tanderil  Ascriptin Extra Strength Dolobid Moblgesic Ticlid  Ascriptin with Codeine Doloprin or Doloprin with Codeine Momentum Tolectin  Asperbuf Duoprin Mono-gesic Trendar  Aspergum Duradyne Motrin or Motrin IB Triminicin  Aspirin plain, buffered or enteric coated Durasal Myochrisine Trigesic  Aspirin Suppositories Easprin Nalfon Trillsate  Aspirin with Codeine Ecotrin Regular or Extra Strength Naprosyn Uracel  Atromid-S Efficin Naproxen Ursinus  Auranofin Capsules Elmiron Neocylate Vanquish  Axotal Emagrin Norgesic Verin  Azathioprine Empirin or Empirin with Codeine Normiflo Vitamin E  Azolid Emprazil Nuprin Voltaren  Bayer  Aspirin plain, buffered or children's or timed BC Tablets or powders Encaprin Orgaran Warfarin Sodium  Buff-a-Comp Enoxaparin Orudis Zorpin  Buff-a-Comp with Codeine Equegesic Os-Cal-Gesic   Buffaprin Excedrin plain, buffered or Extra Strength Oxalid   Bufferin Arthritis Strength Feldene Oxphenbutazone   Bufferin plain or Extra Strength Feldene Capsules Oxycodone with Aspirin   Bufferin with Codeine Fenoprofen Fenoprofen Pabalate or Pabalate-SF   Buffets II Flogesic Panagesic   Buffinol plain or Extra Strength Florinal or Florinal with Codeine Panwarfarin   Buf-Tabs Flurbiprofen Penicillamine   Butalbital Compound Four-way cold tablets   Penicillin   Butazolidin Fragmin Pepto-Bismol   Carbenicillin Geminisyn Percodan   Carna Arthritis Reliever Geopen Persantine   Carprofen Gold's salt Persistin   Chloramphenicol Goody's Phenylbutazone   Chloromycetin Haltrain Piroxlcam   Clmetidine heparin Plaquenil   Cllnoril Hyco-pap Ponstel   Clofibrate Hydroxy chloroquine Propoxyphen         Before stopping any of these medications, be sure to consult the physician who ordered them.  Some, such as Coumadin (Warfarin) are ordered to prevent or treat serious conditions such as "deep thrombosis", "pumonary embolisms", and other heart problems.  The amount of time that you may need off of the medication may also vary with the medication and the reason for which you were taking it.  If you are taking any of these medications, please make sure you notify your pain physician before you undergo any procedures.         Epidural Steroid Injection Patient Information  Description: The epidural space surrounds the nerves as they exit the spinal cord.  In some patients, the nerves can be compressed and inflamed by a bulging disc or a tight spinal canal (spinal stenosis).  By injecting steroids into the epidural space, we can bring irritated nerves into direct contact with a potentially helpful medication.   These steroids act directly on the irritated nerves and can reduce swelling and inflammation which often leads to decreased pain.  Epidural steroids may be injected anywhere along the spine and from the neck to the low back depending upon the location of your pain.   After numbing the skin with local anesthetic (like Novocaine), a small needle is passed into the epidural space slowly.  You may experience a sensation of pressure while this is being done.  The entire block usually last less than 10 minutes.  Conditions which may be treated by epidural steroids:   Low back and leg pain  Neck and arm pain  Spinal stenosis  Post-laminectomy syndrome  Herpes zoster (shingles) pain  Pain from compression fractures  Preparation for the injection:  1. Do not eat any solid food or dairy products within 8 hours of your appointment.  2. You may drink clear liquids up to 3 hours before appointment.  Clear liquids include water, black coffee, juice or soda.  No milk or cream please. 3. You may take your regular medication, including pain medications, with a sip of water before your appointment  Diabetics should hold regular insulin (if taken separately) and take 1/2 normal NPH dos the morning of the procedure.  Carry some sugar containing items with you to your appointment. 4. A driver must accompany you and be prepared to drive you home after your procedure.  5. Bring all your current medications with your. 6. An IV may be inserted and sedation may be given at the discretion of the physician.   7. A blood pressure cuff, EKG and other monitors will often be applied during the procedure.  Some patients may need to have extra oxygen administered for a short period. 8. You will be asked to provide medical information, including your allergies, prior to the procedure.  We must know immediately if you are taking blood thinners (like Coumadin/Warfarin)  Or if you are allergic to IV iodine contrast (dye). We  must know if you could possible be pregnant.  Possible side-effects:  Bleeding from needle site  Infection (rare, may require surgery)  Nerve injury (rare)  Numbness & tingling (temporary)  Difficulty urinating (rare, temporary)  Spinal headache (   a headache worse with upright posture)  Light -headedness (temporary)  Pain at injection site (several days)  Decreased blood pressure (temporary)  Weakness in arm/leg (temporary)  Pressure sensation in back/neck (temporary)  Call if you experience:  Fever/chills associated with headache or increased back/neck pain.  Headache worsened by an upright position.  New onset weakness or numbness of an extremity below the injection site  Hives or difficulty breathing (go to the emergency room)  Inflammation or drainage at the infection site  Severe back/neck pain  Any new symptoms which are concerning to you  Please note:  Although the local anesthetic injected can often make your back or neck feel good for several hours after the injection, the pain will likely return.  It takes 3-7 days for steroids to work in the epidural space.  You may not notice any pain relief for at least that one week.  If effective, we will often do a series of three injections spaced 3-6 weeks apart to maximally decrease your pain.  After the initial series, we generally will wait several months before considering a repeat injection of the same type.  If you have any questions, please call [phone] [facility] 
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
## 2015-09-09 NOTE — ED Notes (Signed)
Pt c/o lower back/flank pain for the past 2 days that radiates around to the lower abd with N/V today.. States he has a hx of kidney stones and feels like this is the same.. States he was at the pain clinic and there were not able to complete his epidural due to his HTN..Marland Kitchen

## 2015-09-09 NOTE — Progress Notes (Signed)
Safety precautions to be maintained throughout the outpatient stay will include: orient to surroundings, keep bed in low position, maintain call bell within reach at all times, provide assistance with transfer out of bed and ambulation.  

## 2015-09-09 NOTE — Discharge Instructions (Signed)
Please seek medical attention for any high fevers, chest pain, shortness of breath, change in behavior, persistent vomiting, bloody stool or any other new or concerning symptoms. ° ° °Kidney Stones °Kidney stones (urolithiasis) are deposits that form inside your kidneys. The intense pain is caused by the stone moving through the urinary tract. When the stone moves, the ureter goes into spasm around the stone. The stone is usually passed in the urine.  °CAUSES  °· A disorder that makes certain neck glands produce too much parathyroid hormone (primary hyperparathyroidism). °· A buildup of uric acid crystals, similar to gout in your joints. °· Narrowing (stricture) of the ureter. °· A kidney obstruction present at birth (congenital obstruction). °· Previous surgery on the kidney or ureters. °· Numerous kidney infections. °SYMPTOMS  °· Feeling sick to your stomach (nauseous). °· Throwing up (vomiting). °· Blood in the urine (hematuria). °· Pain that usually spreads (radiates) to the groin. °· Frequency or urgency of urination. °DIAGNOSIS  °· Taking a history and physical exam. °· Blood or urine tests. °· CT scan. °· Occasionally, an examination of the inside of the urinary bladder (cystoscopy) is performed. °TREATMENT  °· Observation. °· Increasing your fluid intake. °· Extracorporeal shock wave lithotripsy--This is a noninvasive procedure that uses shock waves to break up kidney stones. °· Surgery may be needed if you have severe pain or persistent obstruction. There are various surgical procedures. Most of the procedures are performed with the use of small instruments. Only small incisions are needed to accommodate these instruments, so recovery time is minimized. °The size, location, and chemical composition are all important variables that will determine the proper choice of action for you. Talk to your health care provider to better understand your situation so that you will minimize the risk of injury to yourself  and your kidney.  °HOME CARE INSTRUCTIONS  °· Drink enough water and fluids to keep your urine clear or pale yellow. This will help you to pass the stone or stone fragments. °· Strain all urine through the provided strainer. Keep all particulate matter and stones for your health care provider to see. The stone causing the pain may be as small as a grain of salt. It is very important to use the strainer each and every time you pass your urine. The collection of your stone will allow your health care provider to analyze it and verify that a stone has actually passed. The stone analysis will often identify what you can do to reduce the incidence of recurrences. °· Only take over-the-counter or prescription medicines for pain, discomfort, or fever as directed by your health care provider. °· Keep all follow-up visits as told by your health care provider. This is important. °· Get follow-up X-rays if required. The absence of pain does not always mean that the stone has passed. It may have only stopped moving. If the urine remains completely obstructed, it can cause loss of kidney function or even complete destruction of the kidney. It is your responsibility to make sure X-rays and follow-ups are completed. Ultrasounds of the kidney can show blockages and the status of the kidney. Ultrasounds are not associated with any radiation and can be performed easily in a matter of minutes. °· Make changes to your daily diet as told by your health care provider. You may be told to: °¨ Limit the amount of salt that you eat. °¨ Eat 5 or more servings of fruits and vegetables each day. °¨ Limit the amount of meat,   poultry, fish, and eggs that you eat. °· Collect a 24-hour urine sample as told by your health care provider. You may need to collect another urine sample every 6-12 months. °SEEK MEDICAL CARE IF: °· You experience pain that is progressive and unresponsive to any pain medicine you have been prescribed. °SEEK IMMEDIATE  MEDICAL CARE IF:  °· Pain cannot be controlled with the prescribed medicine. °· You have a fever or shaking chills. °· The severity or intensity of pain increases over 18 hours and is not relieved by pain medicine. °· You develop a new onset of abdominal pain. °· You feel faint or pass out. °· You are unable to urinate. °  °This information is not intended to replace advice given to you by your health care provider. Make sure you discuss any questions you have with your health care provider. °  °Document Released: 02/08/2005 Document Revised: 10/30/2014 Document Reviewed: 07/12/2012 °Elsevier Interactive Patient Education ©2016 [employer] ° °
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
## 2015-09-19 ENCOUNTER — Ambulatory Visit: Payer: Medicare Other

## 2015-10-07 ENCOUNTER — Ambulatory Visit: Payer: Medicare Other | Admitting: Anesthesiology

## 2016-10-05 ENCOUNTER — Encounter (HOSPITAL_COMMUNITY): Payer: Self-pay

## 2016-10-05 ENCOUNTER — Emergency Department (HOSPITAL_COMMUNITY)
Admission: EM | Admit: 2016-10-05 | Discharge: 2016-10-05 | Disposition: A | Discharge status: Home / Self Care | Payer: Medicare Other | Attending: Emergency Medicine | Admitting: Emergency Medicine

## 2016-10-05 ENCOUNTER — Emergency Department (HOSPITAL_COMMUNITY): Payer: Medicare Other

## 2016-10-05 DIAGNOSIS — I10 Essential (primary) hypertension: Secondary | ICD-10-CM | POA: Diagnosis not present

## 2016-10-05 DIAGNOSIS — R109 Unspecified abdominal pain: Secondary | ICD-10-CM | POA: Diagnosis present

## 2016-10-05 DIAGNOSIS — Z79899 Other long term (current) drug therapy: Secondary | ICD-10-CM | POA: Insufficient documentation

## 2016-10-05 DIAGNOSIS — R319 Hematuria, unspecified: Secondary | ICD-10-CM | POA: Insufficient documentation

## 2016-10-05 LAB — URINALYSIS, MICROSCOPIC (REFLEX)
Amorphous Crystal: NONE SEEN
Broad Casts, UA: NONE SEEN
Budding Yeast: NONE SEEN
CA CARBONATE CRYSTAL: NONE SEEN
CA OXALATE CRYS UA: NONE SEEN
CA PHOS CRYS UA: NONE SEEN
CELLULAR CAST UA: NONE SEEN
CYSTINE CRYS UA: NONE SEEN
Crystals: NONE SEEN — AB
Epithelial Casts, UA: NONE SEEN
FATTY CASTS UA: NONE SEEN
Fat - U1-Fat: NONE SEEN
GRANULAR CASTS UA: NONE SEEN
Hyaline Casts, UA: NONE SEEN
Hyphae Yeast: NONE SEEN
Leucine Crys, UA: NONE SEEN
MUCUS: NONE SEEN
NON SQUAMOUS EPITHELIAL: NONE SEEN
OVAL FAT BODY: NONE SEEN
RBC CASTS UA: NONE SEEN
SPERM UA: NONE SEEN
Trichomonas, UA: NONE SEEN
Triple Phosphate Crystal: NONE SEEN
Tyrosine Crys, UA: NONE SEEN
URIC ACID CRYS UA: NONE SEEN
WBC CASTS UA: NONE SEEN
WBC CLUMPS: NONE SEEN
WBC, UA: 0 WBC/hpf (ref 0–5)
Waxy Casts, UA: NONE SEEN

## 2016-10-05 LAB — CBC
HCT: 47.1 % (ref 39.0–52.0)
Hemoglobin: 15.9 g/dL (ref 13.0–17.0)
MCH: 30.1 pg (ref 26.0–34.0)
MCHC: 33.8 g/dL (ref 30.0–36.0)
MCV: 89 fL (ref 78.0–100.0)
PLATELETS: 347 10*3/uL (ref 150–400)
RBC: 5.29 MIL/uL (ref 4.22–5.81)
RDW: 13.9 % (ref 11.5–15.5)
WBC: 5.8 10*3/uL (ref 4.0–10.5)

## 2016-10-05 LAB — COMPREHENSIVE METABOLIC PANEL
ALBUMIN: 5 g/dL (ref 3.5–5.0)
ALK PHOS: 63 U/L (ref 38–126)
ALT: 16 U/L — ABNORMAL LOW (ref 17–63)
ANION GAP: 10 (ref 5–15)
AST: 20 U/L (ref 15–41)
BUN: 5 mg/dL — AB (ref 6–20)
CHLORIDE: 101 mmol/L (ref 101–111)
CO2: 27 mmol/L (ref 22–32)
CREATININE: 0.93 mg/dL (ref 0.61–1.24)
Calcium: 9.6 mg/dL (ref 8.9–10.3)
GFR calc non Af Amer: >60 mL/min (ref >60)
GLUCOSE: 86 mg/dL (ref 65–99)
POTASSIUM: 3.4 mmol/L — AB (ref 3.5–5.1)
SODIUM: 138 mmol/L (ref 135–145)
Total Bilirubin: 0.8 mg/dL (ref 0.3–1.2)
Total Protein: 8.3 g/dL — ABNORMAL HIGH (ref 6.5–8.1)

## 2016-10-05 LAB — URINALYSIS, ROUTINE W REFLEX MICROSCOPIC
Bilirubin Urine: NEGATIVE
GLUCOSE, UA: NEGATIVE mg/dL
Ketones, ur: NEGATIVE mg/dL
Leukocytes, UA: NEGATIVE
Nitrite: NEGATIVE
PH: 6 (ref 5.0–8.0)
PROTEIN: 30 mg/dL — AB
Specific Gravity, Urine: 1.004 — ABNORMAL LOW (ref 1.005–1.030)

## 2016-10-05 LAB — LIPASE, BLOOD: LIPASE: 48 U/L (ref 11–51)

## 2016-10-05 MED ORDER — HYDROMORPHONE HCL 1 MG/ML IJ SOLN
1.0000 mg | Freq: Once | INTRAMUSCULAR | Status: AC
  Administered 2016-10-05: 1 mg via INTRAVENOUS
  Filled 2016-10-05: qty 1

## 2016-10-05 MED ORDER — SODIUM CHLORIDE 0.9 % IV BOLUS (SEPSIS)
1000.0000 mL | Freq: Once | INTRAVENOUS | Status: AC
  Administered 2016-10-05: 1000 mL via INTRAVENOUS

## 2016-10-05 MED ORDER — ONDANSETRON HCL 4 MG PO TABS: 4.0000 mg | ORAL_TABLET | Freq: Three times a day (TID) | ORAL | 0 refills | Status: DC | PRN

## 2016-10-05 MED ORDER — TRAMADOL HCL 50 MG PO TABS: 50.0000 mg | ORAL_TABLET | Freq: Four times a day (QID) | ORAL | 0 refills | Status: DC | PRN
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
## 2016-10-05 NOTE — Discharge Instructions (Signed)
Urinalysis shows blood, but your CT scan does not show any obvious kidney stone in the ureter.  Medication for pain and nausea. Recommend follow-up with urology.

## 2016-10-05 NOTE — ED Triage Notes (Signed)
States he was smoking a cigarette last night, coughed and felt a pop on right side abdomen/right flank that radiates to testicles since last night. States he is urinating blood and unable to void much at this time.
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
## 2016-10-25 ENCOUNTER — Encounter (HOSPITAL_COMMUNITY): Payer: Self-pay

## 2016-10-25 ENCOUNTER — Emergency Department (HOSPITAL_COMMUNITY)
Admission: EM | Admit: 2016-10-25 | Discharge: 2016-10-25 | Disposition: A | Discharge status: Home / Self Care | Payer: Medicare Other | Attending: Emergency Medicine | Admitting: Emergency Medicine

## 2016-10-25 DIAGNOSIS — I10 Essential (primary) hypertension: Secondary | ICD-10-CM | POA: Insufficient documentation

## 2016-10-25 DIAGNOSIS — R079 Chest pain, unspecified: Secondary | ICD-10-CM | POA: Diagnosis not present

## 2016-10-25 DIAGNOSIS — F1910 Other psychoactive substance abuse, uncomplicated: Secondary | ICD-10-CM | POA: Diagnosis not present

## 2016-10-25 DIAGNOSIS — F5089 Other specified eating disorder: Secondary | ICD-10-CM | POA: Insufficient documentation

## 2016-10-25 DIAGNOSIS — M79671 Pain in right foot: Secondary | ICD-10-CM | POA: Diagnosis not present

## 2016-10-25 DIAGNOSIS — Z79899 Other long term (current) drug therapy: Secondary | ICD-10-CM | POA: Diagnosis not present

## 2016-10-25 DIAGNOSIS — R39198 Other difficulties with micturition: Secondary | ICD-10-CM | POA: Diagnosis not present

## 2016-10-25 DIAGNOSIS — R200 Anesthesia of skin: Secondary | ICD-10-CM | POA: Diagnosis present

## 2016-10-25 DIAGNOSIS — F17210 Nicotine dependence, cigarettes, uncomplicated: Secondary | ICD-10-CM | POA: Insufficient documentation

## 2016-10-25 LAB — PROTIME-INR
INR: 1.01
Prothrombin Time: 13.2 seconds (ref 11.4–15.2)

## 2016-10-25 LAB — COMPREHENSIVE METABOLIC PANEL
ALBUMIN: 4.7 g/dL (ref 3.5–5.0)
ALK PHOS: 62 U/L (ref 38–126)
ALT: 23 U/L (ref 17–63)
AST: 39 U/L (ref 15–41)
Anion gap: 9 (ref 5–15)
BILIRUBIN TOTAL: 0.6 mg/dL (ref 0.3–1.2)
BUN: 11 mg/dL (ref 6–20)
CALCIUM: 9.7 mg/dL (ref 8.9–10.3)
CO2: 24 mmol/L (ref 22–32)
CREATININE: 0.9 mg/dL (ref 0.61–1.24)
Chloride: 108 mmol/L (ref 101–111)
GFR calc non Af Amer: >60 mL/min (ref >60)
GLUCOSE: 91 mg/dL (ref 65–99)
Potassium: 3.5 mmol/L (ref 3.5–5.1)
SODIUM: 141 mmol/L (ref 135–145)
TOTAL PROTEIN: 7.7 g/dL (ref 6.5–8.1)

## 2016-10-25 LAB — CBC WITH DIFFERENTIAL/PLATELET
Basophils Absolute: 0 10*3/uL (ref 0.0–0.1)
Basophils Relative: 0 %
EOS PCT: 1 %
Eosinophils Absolute: 0.1 10*3/uL (ref 0.0–0.7)
HCT: 43.4 % (ref 39.0–52.0)
Hemoglobin: 14.9 g/dL (ref 13.0–17.0)
LYMPHS ABS: 1.7 10*3/uL (ref 0.7–4.0)
LYMPHS PCT: 30 %
MCH: 30.1 pg (ref 26.0–34.0)
MCHC: 34.3 g/dL (ref 30.0–36.0)
MCV: 87.7 fL (ref 78.0–100.0)
MONO ABS: 0.4 10*3/uL (ref 0.1–1.0)
MONOS PCT: 7 %
Neutro Abs: 3.6 10*3/uL (ref 1.7–7.7)
Neutrophils Relative %: 62 %
PLATELETS: 290 10*3/uL (ref 150–400)
RBC: 4.95 MIL/uL (ref 4.22–5.81)
RDW: 13.3 % (ref 11.5–15.5)
WBC: 5.9 10*3/uL (ref 4.0–10.5)

## 2016-10-25 LAB — URINALYSIS, ROUTINE W REFLEX MICROSCOPIC
Bilirubin Urine: NEGATIVE
GLUCOSE, UA: NEGATIVE mg/dL
KETONES UR: NEGATIVE mg/dL
LEUKOCYTES UA: NEGATIVE
NITRITE: NEGATIVE
PH: 5 (ref 5.0–8.0)
Protein, ur: 30 mg/dL — AB
Specific Gravity, Urine: 1.023 (ref 1.005–1.030)
Squamous Epithelial / LPF: NONE SEEN

## 2016-10-25 LAB — RAPID URINE DRUG SCREEN, HOSP PERFORMED
Amphetamines: POSITIVE — AB
Barbiturates: NOT DETECTED
Benzodiazepines: NOT DETECTED
Cocaine: POSITIVE — AB
Opiates: NOT DETECTED
Tetrahydrocannabinol: POSITIVE — AB

## 2016-10-25 MED ORDER — ONDANSETRON 4 MG PO TBDP
4.0000 mg | ORAL_TABLET | Freq: Once | ORAL | Status: AC
  Administered 2016-10-25: 4 mg via ORAL
  Filled 2016-10-25: qty 1
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
## 2016-10-25 NOTE — ED Notes (Signed)
Patient states he has been unable to urinate x 2 days. Patient urinated approximately 100 ml urine in urinal. Used bladder scanner on patient, maximum urine remaining in bladder is 21 ml.

## 2016-11-25 ENCOUNTER — Encounter (HOSPITAL_COMMUNITY): Payer: Self-pay | Admitting: *Deleted

## 2016-11-25 ENCOUNTER — Emergency Department (HOSPITAL_COMMUNITY)
Admission: EM | Admit: 2016-11-25 | Discharge: 2016-11-25 | Disposition: A | Discharge status: Home / Self Care | Payer: Medicare Other | Attending: Emergency Medicine | Admitting: Emergency Medicine

## 2016-11-25 DIAGNOSIS — R109 Unspecified abdominal pain: Secondary | ICD-10-CM | POA: Diagnosis not present

## 2016-11-25 NOTE — ED Triage Notes (Signed)
Pt states he was picking up his uncle up and felt a pop in his abdomen. He pushed this area back in but is still having pain there.

## 2017-01-19 ENCOUNTER — Emergency Department (HOSPITAL_COMMUNITY)
Admission: EM | Admit: 2017-01-19 | Discharge: 2017-01-19 | Disposition: A | Discharge status: Home / Self Care | Payer: Medicare Other | Attending: Emergency Medicine | Admitting: Emergency Medicine

## 2017-01-19 ENCOUNTER — Other Ambulatory Visit: Payer: Self-pay

## 2017-01-19 ENCOUNTER — Encounter (HOSPITAL_COMMUNITY): Payer: Self-pay | Admitting: *Deleted

## 2017-01-19 ENCOUNTER — Emergency Department (HOSPITAL_COMMUNITY): Payer: Medicare Other

## 2017-01-19 DIAGNOSIS — I10 Essential (primary) hypertension: Secondary | ICD-10-CM | POA: Insufficient documentation

## 2017-01-19 DIAGNOSIS — Z79899 Other long term (current) drug therapy: Secondary | ICD-10-CM | POA: Insufficient documentation

## 2017-01-19 DIAGNOSIS — F17210 Nicotine dependence, cigarettes, uncomplicated: Secondary | ICD-10-CM | POA: Insufficient documentation

## 2017-01-19 DIAGNOSIS — R1084 Generalized abdominal pain: Secondary | ICD-10-CM | POA: Insufficient documentation

## 2017-01-19 LAB — COMPREHENSIVE METABOLIC PANEL
ALK PHOS: 66 U/L (ref 38–126)
ALT: 27 U/L (ref 17–63)
ANION GAP: 7 (ref 5–15)
AST: 23 U/L (ref 15–41)
Albumin: 4.4 g/dL (ref 3.5–5.0)
BILIRUBIN TOTAL: 0.5 mg/dL (ref 0.3–1.2)
BUN: 7 mg/dL (ref 6–20)
CALCIUM: 9.1 mg/dL (ref 8.9–10.3)
CO2: 29 mmol/L (ref 22–32)
CREATININE: 0.91 mg/dL (ref 0.61–1.24)
Chloride: 103 mmol/L (ref 101–111)
Glucose, Bld: 103 mg/dL — ABNORMAL HIGH (ref 65–99)
Potassium: 3.3 mmol/L — ABNORMAL LOW (ref 3.5–5.1)
SODIUM: 139 mmol/L (ref 135–145)
TOTAL PROTEIN: 7.3 g/dL (ref 6.5–8.1)

## 2017-01-19 LAB — CBC WITH DIFFERENTIAL/PLATELET
Basophils Absolute: 0 10*3/uL (ref 0.0–0.1)
Basophils Relative: 0 %
EOS ABS: 0.1 10*3/uL (ref 0.0–0.7)
Eosinophils Relative: 1 %
HCT: 40.7 % (ref 39.0–52.0)
HEMOGLOBIN: 13.2 g/dL (ref 13.0–17.0)
LYMPHS ABS: 2.4 10*3/uL (ref 0.7–4.0)
LYMPHS PCT: 39 %
MCH: 30 pg (ref 26.0–34.0)
MCHC: 32.4 g/dL (ref 30.0–36.0)
MCV: 92.5 fL (ref 78.0–100.0)
MONOS PCT: 7 %
Monocytes Absolute: 0.4 10*3/uL (ref 0.1–1.0)
NEUTROS PCT: 53 %
Neutro Abs: 3.2 10*3/uL (ref 1.7–7.7)
Platelets: 288 10*3/uL (ref 150–400)
RBC: 4.4 MIL/uL (ref 4.22–5.81)
RDW: 14.2 % (ref 11.5–15.5)
WBC: 6.1 10*3/uL (ref 4.0–10.5)

## 2017-01-19 LAB — LIPASE, BLOOD: LIPASE: 130 U/L — AB (ref 11–51)

## 2017-01-19 MED ORDER — DICYCLOMINE HCL 20 MG PO TABS: 20.0000 mg | ORAL_TABLET | Freq: Two times a day (BID) | ORAL | 0 refills | Status: AC | PRN

## 2017-01-19 MED ORDER — POLYETHYLENE GLYCOL 3350 17 G PO PACK: 17.0000 g | PACK | Freq: Every day | ORAL | 0 refills | Status: DC

## 2017-01-19 MED ORDER — ACETAMINOPHEN 325 MG PO TABS
650.0000 mg | ORAL_TABLET | Freq: Once | ORAL | Status: AC
  Administered 2017-01-19: 650 mg via ORAL
  Filled 2017-01-19: qty 2

## 2017-01-19 MED ORDER — DOCUSATE SODIUM 100 MG PO CAPS: 100.0000 mg | ORAL_CAPSULE | Freq: Two times a day (BID) | ORAL | 0 refills | Status: AC
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
## 2017-01-19 NOTE — ED Triage Notes (Signed)
Pt c/o abd pain with n/v that started this am, denies any diarrhea, fever, does admits to increased in urine frequency, has hx of abd hernia.

## 2017-01-19 NOTE — ED Notes (Signed)
Pt very upset about not getting pain meds while this RN was taking his BP.

## 2017-01-19 NOTE — ED Notes (Addendum)
Pt upset at this nurse because he said he is in pain and isn't getting any pain meds. Pt advised that this RN can only do what the EDP orders that it was not my decision. EDP made aware.

## 2017-01-19 NOTE — ED Notes (Signed)
Pt stating, "all this pain I'm and he gives me tylenol." Pt advised to give the tylenol a chance to work and see if it helps.

## 2017-01-19 NOTE — ED Notes (Signed)
Pt advised that we need a urine specimen

## 2017-07-05 ENCOUNTER — Encounter (HOSPITAL_COMMUNITY): Payer: Self-pay | Admitting: Emergency Medicine

## 2017-07-05 ENCOUNTER — Emergency Department (HOSPITAL_COMMUNITY)
Admission: EM | Admit: 2017-07-05 | Discharge: 2017-07-06 | Disposition: A | Discharge status: Home / Self Care | Payer: Medicare Other | Attending: Emergency Medicine | Admitting: Emergency Medicine

## 2017-07-05 ENCOUNTER — Other Ambulatory Visit: Payer: Self-pay

## 2017-07-05 ENCOUNTER — Emergency Department (HOSPITAL_COMMUNITY): Payer: Medicare Other

## 2017-07-05 DIAGNOSIS — R1011 Right upper quadrant pain: Secondary | ICD-10-CM | POA: Insufficient documentation

## 2017-07-05 DIAGNOSIS — F17210 Nicotine dependence, cigarettes, uncomplicated: Secondary | ICD-10-CM | POA: Insufficient documentation

## 2017-07-05 DIAGNOSIS — Z79899 Other long term (current) drug therapy: Secondary | ICD-10-CM | POA: Diagnosis not present

## 2017-07-05 DIAGNOSIS — R1031 Right lower quadrant pain: Secondary | ICD-10-CM

## 2017-07-05 DIAGNOSIS — I10 Essential (primary) hypertension: Secondary | ICD-10-CM | POA: Insufficient documentation

## 2017-07-05 MED ORDER — LACTATED RINGERS IV BOLUS
1000.0000 mL | Freq: Once | INTRAVENOUS | Status: AC
  Administered 2017-07-06: 1000 mL via INTRAVENOUS

## 2017-07-05 MED ORDER — ONDANSETRON HCL 4 MG/2ML IJ SOLN
4.0000 mg | Freq: Once | INTRAMUSCULAR | Status: AC
  Administered 2017-07-06: 4 mg via INTRAVENOUS
  Filled 2017-07-05: qty 2

## 2017-07-05 MED ORDER — HYDROMORPHONE HCL 1 MG/ML IJ SOLN
1.0000 mg | Freq: Once | INTRAMUSCULAR | Status: AC
  Administered 2017-07-06: 1 mg via INTRAVENOUS
  Filled 2017-07-05: qty 1

## 2017-07-05 MED ORDER — IOPAMIDOL (ISOVUE-300) INJECTION 61%
100.0000 mL | Freq: Once | INTRAVENOUS | Status: AC | PRN
  Administered 2017-07-06: 100 mL via INTRAVENOUS

## 2017-07-05 NOTE — ED Triage Notes (Addendum)
Pt c/o lower abd pain x 3 days and now states he is having trouble urinating. Pt states his penis "has a mind of its own" due to moving around without being able to control it.

## 2017-07-06 LAB — CBC WITH DIFFERENTIAL/PLATELET
BASOS ABS: 0 10*3/uL (ref 0.0–0.1)
BASOS PCT: 1 %
Eosinophils Absolute: 0.1 10*3/uL (ref 0.0–0.7)
Eosinophils Relative: 1 %
HCT: 39.9 % (ref 39.0–52.0)
HEMOGLOBIN: 13.4 g/dL (ref 13.0–17.0)
Lymphocytes Relative: 38 %
Lymphs Abs: 2.2 10*3/uL (ref 0.7–4.0)
MCH: 30.2 pg (ref 26.0–34.0)
MCHC: 33.6 g/dL (ref 30.0–36.0)
MCV: 89.9 fL (ref 78.0–100.0)
MONOS PCT: 7 %
Monocytes Absolute: 0.4 10*3/uL (ref 0.1–1.0)
NEUTROS ABS: 3.1 10*3/uL (ref 1.7–7.7)
NEUTROS PCT: 53 %
Platelets: 246 10*3/uL (ref 150–400)
RBC: 4.44 MIL/uL (ref 4.22–5.81)
RDW: 13.7 % (ref 11.5–15.5)
WBC: 5.8 10*3/uL (ref 4.0–10.5)

## 2017-07-06 LAB — COMPREHENSIVE METABOLIC PANEL
ALK PHOS: 59 U/L (ref 38–126)
ALT: 9 U/L — AB (ref 17–63)
ANION GAP: 9 (ref 5–15)
AST: 20 U/L (ref 15–41)
Albumin: 4.3 g/dL (ref 3.5–5.0)
BILIRUBIN TOTAL: 0.5 mg/dL (ref 0.3–1.2)
BUN: 9 mg/dL (ref 6–20)
CALCIUM: 9.1 mg/dL (ref 8.9–10.3)
CO2: 27 mmol/L (ref 22–32)
Chloride: 104 mmol/L (ref 101–111)
Creatinine, Ser: 0.87 mg/dL (ref 0.61–1.24)
Glucose, Bld: 91 mg/dL (ref 65–99)
Potassium: 3.9 mmol/L (ref 3.5–5.1)
Sodium: 140 mmol/L (ref 135–145)
TOTAL PROTEIN: 7 g/dL (ref 6.5–8.1)

## 2017-07-06 LAB — I-STAT CG4 LACTIC ACID, ED: LACTIC ACID, VENOUS: 1.01 mmol/L (ref 0.5–1.9)

## 2017-07-06 MED ORDER — ACETAMINOPHEN ER 650 MG PO TBCR: 650.0000 mg | EXTENDED_RELEASE_TABLET | Freq: Three times a day (TID) | ORAL | 0 refills | Status: AC
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
## 2017-07-06 NOTE — Discharge Instructions (Signed)
We saw you in the ER for the abdominal pain. °All the results in the ER are normal, labs and imaging. °We are not sure what is causing your symptoms. ° °The workup in the ER is not complete, and is limited to screening for life threatening and emergent conditions only, so please see a primary care doctor for further evaluation. ° °

## 2017-08-22 ENCOUNTER — Inpatient Hospital Stay
Admission: EM | Admit: 2017-08-22 | Discharge: 2017-08-24 | DRG: 683 | Disposition: A | Discharge status: Home / Self Care | Payer: Medicare Other | Attending: Internal Medicine | Admitting: Internal Medicine

## 2017-08-22 ENCOUNTER — Emergency Department: Payer: Medicare Other

## 2017-08-22 ENCOUNTER — Encounter: Payer: Self-pay | Admitting: Emergency Medicine

## 2017-08-22 ENCOUNTER — Other Ambulatory Visit: Payer: Self-pay

## 2017-08-22 DIAGNOSIS — F1410 Cocaine abuse, uncomplicated: Secondary | ICD-10-CM | POA: Diagnosis present

## 2017-08-22 DIAGNOSIS — E860 Dehydration: Secondary | ICD-10-CM | POA: Diagnosis present

## 2017-08-22 DIAGNOSIS — Z79899 Other long term (current) drug therapy: Secondary | ICD-10-CM

## 2017-08-22 DIAGNOSIS — N179 Acute kidney failure, unspecified: Secondary | ICD-10-CM | POA: Diagnosis not present

## 2017-08-22 DIAGNOSIS — Z885 Allergy status to narcotic agent status: Secondary | ICD-10-CM

## 2017-08-22 DIAGNOSIS — N200 Calculus of kidney: Secondary | ICD-10-CM | POA: Diagnosis present

## 2017-08-22 DIAGNOSIS — N3000 Acute cystitis without hematuria: Secondary | ICD-10-CM | POA: Diagnosis present

## 2017-08-22 DIAGNOSIS — G8929 Other chronic pain: Secondary | ICD-10-CM | POA: Diagnosis present

## 2017-08-22 DIAGNOSIS — R1031 Right lower quadrant pain: Secondary | ICD-10-CM

## 2017-08-22 DIAGNOSIS — I10 Essential (primary) hypertension: Secondary | ICD-10-CM | POA: Diagnosis present

## 2017-08-22 DIAGNOSIS — F17210 Nicotine dependence, cigarettes, uncomplicated: Secondary | ICD-10-CM | POA: Diagnosis present

## 2017-08-22 LAB — URINE DRUG SCREEN, QUALITATIVE (ARMC ONLY)
AMPHETAMINES, UR SCREEN: NOT DETECTED
BENZODIAZEPINE, UR SCRN: NOT DETECTED
Cannabinoid 50 Ng, Ur ~~LOC~~: POSITIVE — AB
Cocaine Metabolite,Ur ~~LOC~~: POSITIVE — AB
MDMA (Ecstasy)Ur Screen: NOT DETECTED
METHADONE SCREEN, URINE: NOT DETECTED
OPIATE, UR SCREEN: POSITIVE — AB
PHENCYCLIDINE (PCP) UR S: NOT DETECTED
Tricyclic, Ur Screen: NOT DETECTED

## 2017-08-22 LAB — COMPREHENSIVE METABOLIC PANEL
ALBUMIN: 4.7 g/dL (ref 3.5–5.0)
ALK PHOS: 59 U/L (ref 38–126)
ALT: 16 U/L (ref 0–44)
AST: 20 U/L (ref 15–41)
Anion gap: 13 (ref 5–15)
BUN: 61 mg/dL — AB (ref 6–20)
CALCIUM: 8.6 mg/dL — AB (ref 8.9–10.3)
CHLORIDE: 102 mmol/L (ref 98–111)
CO2: 21 mmol/L — AB (ref 22–32)
CREATININE: 3.48 mg/dL — AB (ref 0.61–1.24)
GFR calc Af Amer: 23 mL/min — ABNORMAL LOW (ref >60)
GFR calc non Af Amer: 20 mL/min — ABNORMAL LOW (ref >60)
GLUCOSE: 87 mg/dL (ref 70–99)
Potassium: 3.9 mmol/L (ref 3.5–5.1)
SODIUM: 136 mmol/L (ref 135–145)
Total Bilirubin: 0.9 mg/dL (ref 0.3–1.2)
Total Protein: 7.1 g/dL (ref 6.5–8.1)

## 2017-08-22 LAB — URINALYSIS, COMPLETE (UACMP) WITH MICROSCOPIC
BILIRUBIN URINE: NEGATIVE
Bacteria, UA: NONE SEEN
Glucose, UA: NEGATIVE mg/dL
Hgb urine dipstick: NEGATIVE
KETONES UR: NEGATIVE mg/dL
Leukocytes, UA: NEGATIVE
Nitrite: POSITIVE — AB
PH: 5 (ref 5.0–8.0)
Protein, ur: NEGATIVE mg/dL
SPECIFIC GRAVITY, URINE: 1.014 (ref 1.005–1.030)

## 2017-08-22 LAB — CBC
HCT: 41.6 % (ref 40.0–52.0)
Hemoglobin: 14.7 g/dL (ref 13.0–18.0)
MCH: 31.1 pg (ref 26.0–34.0)
MCHC: 35.4 g/dL (ref 32.0–36.0)
MCV: 87.9 fL (ref 80.0–100.0)
PLATELETS: 245 10*3/uL (ref 150–440)
RBC: 4.73 MIL/uL (ref 4.40–5.90)
RDW: 13.9 % (ref 11.5–14.5)
WBC: 5.1 10*3/uL (ref 3.8–10.6)

## 2017-08-22 LAB — LIPASE, BLOOD: LIPASE: 67 U/L — AB (ref 11–51)

## 2017-08-22 MED ORDER — POLYETHYLENE GLYCOL 3350 17 G PO PACK
17.0000 g | PACK | Freq: Every day | ORAL | Status: DC
Start: 2017-08-23 — End: 2017-08-24
  Filled 2017-08-22: qty 1

## 2017-08-22 MED ORDER — DICYCLOMINE HCL 20 MG PO TABS
20.0000 mg | ORAL_TABLET | Freq: Two times a day (BID) | ORAL | Status: DC | PRN
  Filled 2017-08-22: qty 1

## 2017-08-22 MED ORDER — PANTOPRAZOLE SODIUM 40 MG PO TBEC
40.0000 mg | DELAYED_RELEASE_TABLET | Freq: Every day | ORAL | Status: DC
  Administered 2017-08-23: 40 mg via ORAL
  Filled 2017-08-22: qty 1

## 2017-08-22 MED ORDER — LEVOFLOXACIN 500 MG PO TABS: 250.0000 mg | ORAL_TABLET | ORAL | Status: DC

## 2017-08-22 MED ORDER — ACETAMINOPHEN 650 MG RE SUPP: 650.0000 mg | Freq: Four times a day (QID) | RECTAL | Status: DC | PRN

## 2017-08-22 MED ORDER — SODIUM CHLORIDE 0.9 % IV SOLN
INTRAVENOUS | Status: DC
  Administered 2017-08-22 – 2017-08-24 (×5): via INTRAVENOUS

## 2017-08-22 MED ORDER — METOPROLOL SUCCINATE ER 50 MG PO TB24
100.0000 mg | ORAL_TABLET | Freq: Every day | ORAL | Status: DC
  Administered 2017-08-23: 100 mg via ORAL
  Filled 2017-08-22: qty 2

## 2017-08-22 MED ORDER — HYDROMORPHONE HCL 1 MG/ML IJ SOLN
0.5000 mg | Freq: Once | INTRAMUSCULAR | Status: AC
  Administered 2017-08-22: 0.5 mg via INTRAVENOUS
  Filled 2017-08-22: qty 1

## 2017-08-22 MED ORDER — LEVOFLOXACIN 500 MG PO TABS
500.0000 mg | ORAL_TABLET | ORAL | Status: AC
  Administered 2017-08-22: 500 mg via ORAL
  Filled 2017-08-22: qty 1

## 2017-08-22 MED ORDER — PROMETHAZINE HCL 25 MG/ML IJ SOLN
12.5000 mg | Freq: Four times a day (QID) | INTRAMUSCULAR | Status: DC | PRN
  Administered 2017-08-22: 12.5 mg via INTRAVENOUS
  Filled 2017-08-22: qty 1

## 2017-08-22 MED ORDER — ONDANSETRON HCL 4 MG PO TABS: 4.0000 mg | ORAL_TABLET | Freq: Four times a day (QID) | ORAL | Status: DC | PRN

## 2017-08-22 MED ORDER — TIZANIDINE HCL 4 MG PO TABS
8.0000 mg | ORAL_TABLET | Freq: Every day | ORAL | Status: DC
Start: 2017-08-22 — End: 2017-08-24
  Administered 2017-08-22 – 2017-08-23 (×2): 8 mg via ORAL
  Filled 2017-08-22 (×3): qty 2

## 2017-08-22 MED ORDER — LACTATED RINGERS IV BOLUS: 1000.0000 mL | Freq: Once | INTRAVENOUS | Status: DC

## 2017-08-22 MED ORDER — ACETAMINOPHEN 325 MG PO TABS
650.0000 mg | ORAL_TABLET | Freq: Four times a day (QID) | ORAL | Status: DC | PRN
  Administered 2017-08-23 (×2): 650 mg via ORAL
  Filled 2017-08-22 (×3): qty 2

## 2017-08-22 MED ORDER — DOCUSATE SODIUM 100 MG PO CAPS
100.0000 mg | ORAL_CAPSULE | Freq: Two times a day (BID) | ORAL | Status: DC
  Administered 2017-08-22 – 2017-08-23 (×3): 100 mg via ORAL
  Filled 2017-08-22 (×3): qty 1

## 2017-08-22 MED ORDER — FENTANYL CITRATE (PF) 100 MCG/2ML IJ SOLN
100.0000 ug | INTRAMUSCULAR | Status: DC | PRN
  Administered 2017-08-22: 100 ug via INTRAVENOUS
  Filled 2017-08-22: qty 2

## 2017-08-22 MED ORDER — HEPARIN SODIUM (PORCINE) 5000 UNIT/ML IJ SOLN
5000.0000 [IU] | Freq: Three times a day (TID) | INTRAMUSCULAR | Status: DC
  Administered 2017-08-22 – 2017-08-24 (×5): 5000 [IU] via SUBCUTANEOUS
  Filled 2017-08-22 (×5): qty 1

## 2017-08-22 MED ORDER — SODIUM CHLORIDE 0.9 % IV BOLUS
1000.0000 mL | Freq: Once | INTRAVENOUS | Status: AC
Start: 2017-08-22 — End: 2017-08-22
  Administered 2017-08-22: 1000 mL via INTRAVENOUS

## 2017-08-22 MED ORDER — OXYCODONE-ACETAMINOPHEN 5-325 MG PO TABS
1.0000 | ORAL_TABLET | Freq: Four times a day (QID) | ORAL | Status: DC | PRN
  Administered 2017-08-22 – 2017-08-23 (×4): 1 via ORAL
  Filled 2017-08-22 (×4): qty 1

## 2017-08-22 MED ORDER — ONDANSETRON HCL 4 MG/2ML IJ SOLN
4.0000 mg | Freq: Four times a day (QID) | INTRAMUSCULAR | Status: DC | PRN
  Administered 2017-08-23: 4 mg via INTRAVENOUS
  Filled 2017-08-22: qty 2

## 2017-08-22 NOTE — ED Triage Notes (Signed)
Lower R abdominal pain x 3 days. Nausea and vomiting. Denies fevers.

## 2017-08-22 NOTE — Progress Notes (Signed)
Pharmacy Antibiotic Note  Lanette HampshireKevin D Burkemper is a 42 y.o. male admitted on 08/22/2017 with UTI.  Pharmacy has been consulted for Levaquin dosing. Hx kidney stones   Plan: Levaquin 500 mg po x 1 then continue with 250 mg po q24h for crcl 29.8 ml/min  Height: 6' (182.9 cm) Weight: 170 lb (77.1 kg) IBW/kg (Calculated) : 77.6  Temp (24hrs), Avg:97.6 F (36.4 C), Min:97.6 F (36.4 C), Max:97.6 F (36.4 C)  Recent Labs  Lab 08/22/17 1619  WBC 5.1  CREATININE 3.48*    Estimated Creatinine Clearance: 29.8 mL/min (A) (by C-G formula based on SCr of 3.48 mg/dL (H)).    Allergies  Allergen Reactions  . Morphine And Related Itching    Antimicrobials this admission: levaquin 7/1 >>       >>    Dose adjustments this admission:    Microbiology results:   BCx:   7/1 UCx:  pend    Sputum:      MRSA PCR:    Thank you for allowing pharmacy to be a part of this patient's care.  Shevonne Wolf A 08/22/2017 7:19 PM

## 2017-08-22 NOTE — ED Notes (Signed)
EMS , flank pain , IV right AC , was given Fentanyl , pt resting comfortably
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
## 2017-08-22 NOTE — Progress Notes (Signed)
Advanced care plan. Purpose of the Encounter: CODE STATUS Parties in Attendance:Patient and family Patient's Decision Capacity:Good Subjective/Patient's story: Presented for weakness  Objective/Medical story Has dehydration and and acute kidney injury Goals of care determination:  Advance care directives and goals of care discussed  Patient wants everything done which includes cpr and intubation if need arises CODE STATUS: Full code Time spent discussing advanced care planning: 16 minutes
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
## 2017-08-22 NOTE — ED Triage Notes (Signed)
Pt in via [facility] with c/o flank pain. EMS reports pt with IV in rigth AC #20, was given 200mcg fentanyl, 8mg  of zofran and 600mls of NS. EMS reports per pt has been  Intermittent and getting worse for the past 2 weeks. Pt also with NV and hematuria.

## 2017-08-23 LAB — BASIC METABOLIC PANEL
ANION GAP: 8 (ref 5–15)
BUN: 43 mg/dL — ABNORMAL HIGH (ref 6–20)
CALCIUM: 8.1 mg/dL — AB (ref 8.9–10.3)
CO2: 23 mmol/L (ref 22–32)
Chloride: 110 mmol/L (ref 98–111)
Creatinine, Ser: 1.95 mg/dL — ABNORMAL HIGH (ref 0.61–1.24)
GFR, EST AFRICAN AMERICAN: 47 mL/min — AB (ref >60)
GFR, EST NON AFRICAN AMERICAN: 40 mL/min — AB (ref >60)
Glucose, Bld: 92 mg/dL (ref 70–99)
Potassium: 4.2 mmol/L (ref 3.5–5.1)
SODIUM: 141 mmol/L (ref 135–145)

## 2017-08-23 LAB — CBC
HCT: 37 % — ABNORMAL LOW (ref 40.0–52.0)
HEMOGLOBIN: 12.8 g/dL — AB (ref 13.0–18.0)
MCH: 30.8 pg (ref 26.0–34.0)
MCHC: 34.6 g/dL (ref 32.0–36.0)
MCV: 89 fL (ref 80.0–100.0)
Platelets: 217 10*3/uL (ref 150–440)
RBC: 4.15 MIL/uL — AB (ref 4.40–5.90)
RDW: 14.1 % (ref 11.5–14.5)
WBC: 3.9 10*3/uL (ref 3.8–10.6)

## 2017-08-23 MED ORDER — PREMIER PROTEIN SHAKE: 11.0000 [oz_av] | Freq: Two times a day (BID) | ORAL | Status: DC

## 2017-08-23 MED ORDER — OXYCODONE-ACETAMINOPHEN 5-325 MG PO TABS
1.0000 | ORAL_TABLET | ORAL | Status: DC | PRN
  Administered 2017-08-23 – 2017-08-24 (×4): 1 via ORAL
  Filled 2017-08-23 (×4): qty 1

## 2017-08-23 MED ORDER — LEVOFLOXACIN 500 MG PO TABS
500.0000 mg | ORAL_TABLET | ORAL | Status: DC
  Administered 2017-08-23: 500 mg via ORAL
  Filled 2017-08-23: qty 1

## 2017-08-23 MED ORDER — ADULT MULTIVITAMIN W/MINERALS CH
1.0000 | ORAL_TABLET | Freq: Every day | ORAL | Status: DC
  Administered 2017-08-23: 1 via ORAL
  Filled 2017-08-23: qty 1

## 2017-08-23 NOTE — Progress Notes (Signed)
Pt states he passed a kidney stone while he was having a bowel movement. He complains of right sided pain. He is rating his pain 10/10 now.

## 2017-08-23 NOTE — Progress Notes (Signed)
[facility] [place] [facility]   PATIENT NAME: Adam GosselinKevin Kittrell    MR#:  [id number]  DATE OF BIRTH:  06/19/1975  SUBJECTIVE:  CHIEF COMPLAINT: Patient denies any nausea vomiting.  Chronic low back pain.  Urine drug screen is positive for cocaine.  REVIEW OF SYSTEMS:  CONSTITUTIONAL: No fever, fatigue or weakness.  EYES: No blurred or double vision.  EARS, NOSE, AND THROAT: No tinnitus or ear pain.  RESPIRATORY: No cough, shortness of breath, wheezing or hemoptysis.  CARDIOVASCULAR: No chest pain, orthopnea, edema.  GASTROINTESTINAL: No nausea, vomiting, diarrhea or abdominal pain.  GENITOURINARY: No dysuria, hematuria.  ENDOCRINE: No polyuria, nocturia,  HEMATOLOGY: No anemia, easy bruising or bleeding SKIN: No rash or lesion. MUSCULOSKELETAL: Has chronic low back pain no joint pain or arthritis.   NEUROLOGIC: No tingling, numbness, weakness.  PSYCHIATRY: No anxiety or depression.   DRUG ALLERGIES:   Allergies  Allergen Reactions  . Morphine And Related Itching    VITALS:  Blood pressure (!) 140/91, pulse (!) 52, temperature 98 F (36.7 C), temperature source Oral, resp. rate 18, height 6' (1.829 m), weight 75.3 kg (166 lb 1.6 oz), SpO2 100 %.  PHYSICAL EXAMINATION:  GENERAL:  42 y.o.-year-old patient lying in the bed with no acute distress.  EYES: Pupils equal, round, reactive to light and accommodation. No scleral icterus. Extraocular muscles intact.  HEENT: Head atraumatic, normocephalic. Oropharynx and nasopharynx clear.  NECK:  Supple, no jugular venous distention. No thyroid enlargement, no tenderness.  LUNGS: Normal breath sounds bilaterally, no wheezing, rales,rhonchi or crepitation. No use of accessory muscles of respiration.  CARDIOVASCULAR: S1, S2 normal. No murmurs, rubs, or gallops.  ABDOMEN: Soft, nontender, nondistended. Bowel sounds present. No organomegaly or mass.  EXTREMITIES: Diffuse low back tenderness no vertebral tenderness  no pedal edema, cyanosis, or clubbing.  NEUROLOGIC: Cranial nerves II through XII are intact. Muscle strength at his baseline in all extremities. Sensation intact. Gait not checked.  PSYCHIATRIC: The patient is alert and oriented x 3.  SKIN: No obvious rash, lesion, or ulcer.    LABORATORY PANEL:   CBC Recent Labs  Lab 08/23/17 0451  WBC 3.9  HGB 12.8*  HCT 37.0*  PLT 217   ------------------------------------------------------------------------------------------------------------------  Chemistries  Recent Labs  Lab 08/22/17 1619 08/23/17 0451  NA 136 141  K 3.9 4.2  CL 102 110  CO2 21* 23  GLUCOSE 87 92  BUN 61* 43*  CREATININE 3.48* 1.95*  CALCIUM 8.6* 8.1*  AST 20  --   ALT 16  --   ALKPHOS 59  --   BILITOT 0.9  --    ------------------------------------------------------------------------------------------------------------------  Cardiac Enzymes No results for input(s): TROPONINI in the last 168 hours. ------------------------------------------------------------------------------------------------------------------  RADIOLOGY:  Ct Abdomen Pelvis Wo Contrast  Result Date: 08/22/2017 CLINICAL DATA:  42 year old male with flank pain, nausea, vomiting and hematuria for 3 days. History kidney stones. Initial encounter. EXAM: CT ABDOMEN AND PELVIS WITHOUT CONTRAST TECHNIQUE: Multidetector CT imaging of the abdomen and pelvis was performed following the standard protocol without IV contrast. COMPARISON:  07/06/2017 CT. FINDINGS: Lower chest: Lung bases clear.  Heart size within normal limits. Hepatobiliary: Dome liver subcentimeter low-density structure probably a cyst and unchanged. Taking into account limitation by non contrast imaging, no worrisome hepatic lesion. No calcified gallstone or common bile duct. Pancreas: Taking into account limitation by non contrast imaging, no worrisome pancreatic mass or inflammation. Spleen: Taking into account limitation by non  contrast imaging, no splenic mass or  enlargement. Adrenals/Urinary Tract: No renal/ureteral obstructing stone or hydronephrosis. Multiple bilateral nonobstructing renal calculi. Right renal low-density structure probably a cyst. No adrenal mass. Noncontrast filled imaging of the urinary bladder unremarkable. Stomach/Bowel: No extraluminal bowel inflammatory process. Specifically, no inflammation surrounds the appendix. Vascular/Lymphatic: No abdominal aortic aneurysm. Trace calcification lower abdominal aorta/iliac arteries. Scattered small lymph nodes without adenopathy. Reproductive: No worrisome abnormality Other: No free air or bowel containing hernia. Musculoskeletal: No acute osseous abnormality. IMPRESSION: No renal/ureteral obstructing stone or hydronephrosis. Multiple bilateral nonobstructing renal calculi. Aortic Atherosclerosis (ICD10-I70.0). Electronically Signed   By: Lacy Duverney M.D.   On: 08/22/2017 17:44    EKG:   Orders placed or performed during the hospital encounter of 08/22/17  . EKG 12-Lead  . EKG 12-Lead    ASSESSMENT AND PLAN:    42 year old male patient with history of hypertension, chronic back pain, nephrolithiasis presented to the emergency room with nausea vomiting and flank pain.  -Acute kidney injury secondary to dehydration IV fluids, clinically improving.  Creatinine 3.48-1.95  check a.m. labs    -Dehydration Hydration with normal saline  -Acute cystitis Oral Levaquin antibiotic Lopurin culture and sensitivity  -Polysubstance abuse Substance abuse counseling given to the patient  -Tobacco abuse Counseling given for 6 minutes Nicotine patch offered  -DVT prophylaxis subcu heparin      All the records are reviewed and case discussed with Care Management/Social Workerr. Management plans discussed with the patient, family and they are in agreement.  CODE STATUS: fc   TOTAL TIME TAKING CARE OF THIS PATIENT:35  minutes.   POSSIBLE D/C  IN 1-2  DAYS, DEPENDING ON CLINICAL CONDITION.  Note: This dictation was prepared with Dragon dictation along with smaller phrase technology. Any transcriptional errors that result from this process are unintentional.   Ramonita Lab M.D on 08/23/2017 at 5:25 PM  Between 7am to 6pm - Pager - [phone] After 6pm go to [email or web address] - password [email or web address] [facility]  [facility]  Office  [phone]  CC: Primary care physician; Center, [facility]

## 2017-08-23 NOTE — Progress Notes (Addendum)
Initial Nutrition Assessment  DOCUMENTATION CODES:   Not applicable  INTERVENTION:   Premier Protein BID, each supplement provides 160 kcal and 30 grams of protein.   MVI daily  Liberalize diet   NUTRITION DIAGNOSIS:   Unintentional weight loss related to social / environmental circumstances as evidenced by 10 percent weight loss in 6 months.  GOAL:   Patient will meet greater than or equal to 90% of their needs  MONITOR:   PO intake, Supplement acceptance, Labs, Weight trends, Skin, I & O's  REASON FOR ASSESSMENT:   Malnutrition Screening Tool    ASSESSMENT:   42 y.o. male with a known history of hypertension, nephrolithiasis, pancreatitis in the past, chronic back pain presented to the emergency room for flank pain and nausea and vomiting.   Met with pt in room today. Pt reports poor appetite and oral intake for the past 6 months. Pt reports that his appetite began to decrease in November when he had treatment for a right-sided inguinal hernia. Per chart, pt has lost 18lbs(10%) over the past 6 months; this is significant given the time frame. Pt reports that he weighed 220lbs a few years ago. Pt reports his appetite is good today. Pt eating 100% of meals. Pt is willing to try chocolate Premier Protein but does not like Ensure. Pt with h/o substance abuse; suspect this has an effect on pt's appetite and oral intake. Pt reports that he smokes mariajuana and that this used to increase his appetite but lately it hasn't made any difference. RD will order supplements and liberalize diet as a heart healthy diet restricts protein also.    Medications reviewed and include: colace, heparin, protonix, miralax, zofran, oxycodone  Labs reviewed: BUN 43(H), creat 1.95(H), Ca 8.1(L)   NUTRITION - FOCUSED PHYSICAL EXAM:    Most Recent Value  Orbital Region  No depletion  Upper Arm Region  Mild depletion  Thoracic and Lumbar Region  Mild depletion  Buccal Region  No depletion  Temple  Region  No depletion  Clavicle Bone Region  Mild depletion  Clavicle and Acromion Bone Region  Mild depletion  Scapular Bone Region  No depletion  Dorsal Hand  No depletion  Patellar Region  Mild depletion  Anterior Thigh Region  Mild depletion  Posterior Calf Region  Mild depletion  Edema (RD Assessment)  None  Hair  Reviewed  Eyes  Reviewed  Mouth  Reviewed  Skin  Reviewed  Nails  Reviewed     Diet Order:   Diet Order           Diet Heart Room service appropriate? Yes; Fluid consistency: Thin  Diet effective now         EDUCATION NEEDS:   Education needs have been addressed  Skin:  Skin Assessment: Reviewed RN Assessment  Last BM:  7/2- type 1  Height:   Ht Readings from Last 1 Encounters:  08/22/17 6' (1.829 m)    Weight:   Wt Readings from Last 1 Encounters:  08/23/17 166 lb 1.6 oz (75.3 kg)    Ideal Body Weight:  80.9 kg  BMI:  Body mass index is 22.53 kg/m.  Estimated Nutritional Needs:   Kcal:  1900-2200kcal/day   Protein:  90-106g/day   Fluid:  >2.2L/day   Koleen Distance MS, RD, LDN Pager #- [phone] Office#- [phone] After Hours Pager: [phone]

## 2017-08-23 NOTE — Progress Notes (Signed)
Pharmacy Antibiotic Note  Lanette HampshireKevin D Belsky is a 42 y.o. male admitted on 08/22/2017 with UTI.  Pharmacy has been consulted for Levaquin dosing. Hx kidney stones   Plan: Crcl improved. Will adjust levaquin to 500 mg po q24h   Height: 6' (182.9 cm) Weight: 160 lb 12.8 oz (72.9 kg) IBW/kg (Calculated) : 77.6  Temp (24hrs), Avg:97.9 F (36.6 C), Min:97.6 F (36.4 C), Max:98.1 F (36.7 C)  Recent Labs  Lab 08/22/17 1619 08/23/17 0451  WBC 5.1 3.9  CREATININE 3.48* 1.95*    Estimated Creatinine Clearance: 50.4 mL/min (A) (by C-G formula based on SCr of 1.95 mg/dL (H)).    Allergies  Allergen Reactions  . Morphine And Related Itching    Antimicrobials this admission: levaquin 7/1 >>       >>    Dose adjustments this admission:    Microbiology results:   BCx:   7/1 UCx:  pend    Sputum:      MRSA PCR:    Thank you for allowing pharmacy to be a part of this patient's care.  Makynlee Kressin A 08/23/2017 9:35 AM

## 2017-08-24 LAB — BASIC METABOLIC PANEL
Anion gap: 4 — ABNORMAL LOW (ref 5–15)
BUN: 19 mg/dL (ref 6–20)
CALCIUM: 8.6 mg/dL — AB (ref 8.9–10.3)
CHLORIDE: 114 mmol/L — AB (ref 98–111)
CO2: 24 mmol/L (ref 22–32)
CREATININE: 0.95 mg/dL (ref 0.61–1.24)
GFR calc Af Amer: >60 mL/min (ref >60)
GLUCOSE: 85 mg/dL (ref 70–99)
POTASSIUM: 4.5 mmol/L (ref 3.5–5.1)
Sodium: 142 mmol/L (ref 135–145)

## 2017-08-24 LAB — URINE CULTURE: Culture: NO GROWTH

## 2017-08-24 LAB — HIV ANTIBODY (ROUTINE TESTING W REFLEX): HIV SCREEN 4TH GENERATION: NONREACTIVE

## 2017-08-24 MED ORDER — AMLODIPINE BESYLATE 5 MG PO TABS: 5.0000 mg | ORAL_TABLET | Freq: Every day | ORAL | Status: DC

## 2017-08-24 MED ORDER — CEPHALEXIN 500 MG PO CAPS: 500.0000 mg | ORAL_CAPSULE | Freq: Two times a day (BID) | ORAL | 0 refills | Status: AC

## 2017-08-24 MED ORDER — CEPHALEXIN 500 MG PO CAPS: 500.0000 mg | ORAL_CAPSULE | Freq: Two times a day (BID) | ORAL | Status: DC

## 2017-08-24 MED ORDER — AMLODIPINE BESYLATE 5 MG PO TABS: 5.0000 mg | ORAL_TABLET | Freq: Every day | ORAL | 1 refills | Status: AC
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
## 2017-10-03 ENCOUNTER — Emergency Department: Payer: Medicare Other

## 2017-10-03 ENCOUNTER — Other Ambulatory Visit: Payer: Self-pay

## 2017-10-03 ENCOUNTER — Emergency Department
Admission: EM | Admit: 2017-10-03 | Discharge: 2017-10-03 | Disposition: A | Discharge status: Home / Self Care | Payer: Medicare Other | Attending: Emergency Medicine | Admitting: Emergency Medicine

## 2017-10-03 ENCOUNTER — Encounter: Payer: Self-pay | Admitting: *Deleted

## 2017-10-03 DIAGNOSIS — Z79899 Other long term (current) drug therapy: Secondary | ICD-10-CM | POA: Diagnosis not present

## 2017-10-03 DIAGNOSIS — G8929 Other chronic pain: Secondary | ICD-10-CM | POA: Insufficient documentation

## 2017-10-03 DIAGNOSIS — R109 Unspecified abdominal pain: Secondary | ICD-10-CM | POA: Insufficient documentation

## 2017-10-03 DIAGNOSIS — F17210 Nicotine dependence, cigarettes, uncomplicated: Secondary | ICD-10-CM | POA: Diagnosis not present

## 2017-10-03 DIAGNOSIS — I10 Essential (primary) hypertension: Secondary | ICD-10-CM | POA: Diagnosis not present

## 2017-10-03 DIAGNOSIS — N4889 Other specified disorders of penis: Secondary | ICD-10-CM | POA: Insufficient documentation

## 2017-10-03 LAB — URINALYSIS, COMPLETE (UACMP) WITH MICROSCOPIC
BACTERIA UA: NONE SEEN
Bilirubin Urine: NEGATIVE
Glucose, UA: NEGATIVE mg/dL
Hgb urine dipstick: NEGATIVE
KETONES UR: NEGATIVE mg/dL
LEUKOCYTES UA: NEGATIVE
Nitrite: NEGATIVE
PH: 7 (ref 5.0–8.0)
Protein, ur: NEGATIVE mg/dL
Specific Gravity, Urine: 1.011 (ref 1.005–1.030)
Squamous Epithelial / HPF: NONE SEEN (ref 0–5)

## 2017-10-03 LAB — HEPATIC FUNCTION PANEL
ALBUMIN: 4.3 g/dL (ref 3.5–5.0)
ALT: 20 U/L (ref 0–44)
AST: 31 U/L (ref 15–41)
Alkaline Phosphatase: 72 U/L (ref 38–126)
BILIRUBIN TOTAL: 0.5 mg/dL (ref 0.3–1.2)
Bilirubin, Direct: <0.1 mg/dL (ref 0.0–0.2)
Total Protein: 7.5 g/dL (ref 6.5–8.1)

## 2017-10-03 LAB — BASIC METABOLIC PANEL
Anion gap: 9 (ref 5–15)
CHLORIDE: 105 mmol/L (ref 98–111)
CO2: 27 mmol/L (ref 22–32)
CREATININE: 0.81 mg/dL (ref 0.61–1.24)
Calcium: 9.2 mg/dL (ref 8.9–10.3)
GFR calc Af Amer: >60 mL/min (ref >60)
GFR calc non Af Amer: >60 mL/min (ref >60)
Glucose, Bld: 112 mg/dL — ABNORMAL HIGH (ref 70–99)
POTASSIUM: 3.2 mmol/L — AB (ref 3.5–5.1)
Sodium: 141 mmol/L (ref 135–145)

## 2017-10-03 LAB — CBC
HEMATOCRIT: 39.7 % — AB (ref 40.0–52.0)
Hemoglobin: 13.8 g/dL (ref 13.0–18.0)
MCH: 31.2 pg (ref 26.0–34.0)
MCHC: 34.7 g/dL (ref 32.0–36.0)
MCV: 89.9 fL (ref 80.0–100.0)
PLATELETS: 312 10*3/uL (ref 150–440)
RBC: 4.42 MIL/uL (ref 4.40–5.90)
RDW: 14.1 % (ref 11.5–14.5)
WBC: 7.2 10*3/uL (ref 3.8–10.6)

## 2017-10-03 MED ORDER — POTASSIUM CHLORIDE CRYS ER 20 MEQ PO TBCR
EXTENDED_RELEASE_TABLET | ORAL | Status: AC
  Filled 2017-10-03: qty 2

## 2017-10-03 MED ORDER — POTASSIUM CHLORIDE CRYS ER 20 MEQ PO TBCR
40.0000 meq | EXTENDED_RELEASE_TABLET | Freq: Once | ORAL | Status: AC
  Administered 2017-10-03: 40 meq via ORAL
  Filled 2017-10-03: qty 2

## 2017-10-03 NOTE — ED Notes (Signed)
Patient transported to CT 

## 2017-10-03 NOTE — ED Notes (Signed)
Esign not working pt verbalized discharge instructions  

## 2017-10-03 NOTE — ED Notes (Signed)
Pt back in room from CT scan.
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
## 2017-10-03 NOTE — Discharge Instructions (Addendum)
Call urology tomorrow to setup a close follow-up as soon as possible.  Please return to the emergency room right away if you are to develop a fever, have an erection that won't go down for more than 1 hour, severe nausea, your pain becomes  severe or worsens, you are unable to keep food down, begin vomiting any dark or bloody fluid, you develop any dark or bloody stools, feel dehydrated, or other new concerns or symptoms arise.

## 2017-10-03 NOTE — ED Triage Notes (Signed)
Pt to ED reporting he has a kidney stone in his penis that "has been moving around since December" Pt reports that he knows "the kidney stone has been falling back into his bladder but is now back in my shaft"   Pt was able to urinate today but reports having been losing weight and strength since December. PT reports while pushing on the toilet last night he had a syncopal episode and hit his nose on the floor. Tissue in pts nose currently. No blood noted.

## 2017-10-05 LAB — URINE CULTURE
CULTURE: NO GROWTH
Special Requests: NORMAL

## 2017-11-19 ENCOUNTER — Encounter (HOSPITAL_COMMUNITY): Payer: Self-pay | Admitting: Emergency Medicine

## 2017-11-19 ENCOUNTER — Emergency Department (HOSPITAL_COMMUNITY): Payer: Medicare Other

## 2017-11-19 ENCOUNTER — Ambulatory Visit (HOSPITAL_COMMUNITY): Payer: Medicare Other

## 2017-11-19 ENCOUNTER — Emergency Department (HOSPITAL_COMMUNITY)
Admission: EM | Admit: 2017-11-19 | Discharge: 2017-11-19 | Disposition: A | Discharge status: Home / Self Care | Payer: Medicare Other | Attending: Emergency Medicine | Admitting: Emergency Medicine

## 2017-11-19 ENCOUNTER — Other Ambulatory Visit: Payer: Self-pay

## 2017-11-19 DIAGNOSIS — Z79899 Other long term (current) drug therapy: Secondary | ICD-10-CM | POA: Diagnosis not present

## 2017-11-19 DIAGNOSIS — Y939 Activity, unspecified: Secondary | ICD-10-CM | POA: Insufficient documentation

## 2017-11-19 DIAGNOSIS — F17210 Nicotine dependence, cigarettes, uncomplicated: Secondary | ICD-10-CM | POA: Diagnosis not present

## 2017-11-19 DIAGNOSIS — S3991XA Unspecified injury of abdomen, initial encounter: Secondary | ICD-10-CM

## 2017-11-19 DIAGNOSIS — R42 Dizziness and giddiness: Secondary | ICD-10-CM | POA: Diagnosis present

## 2017-11-19 DIAGNOSIS — Y999 Unspecified external cause status: Secondary | ICD-10-CM | POA: Diagnosis not present

## 2017-11-19 DIAGNOSIS — I10 Essential (primary) hypertension: Secondary | ICD-10-CM | POA: Insufficient documentation

## 2017-11-19 DIAGNOSIS — W19XXXA Unspecified fall, initial encounter: Secondary | ICD-10-CM | POA: Diagnosis not present

## 2017-11-19 DIAGNOSIS — S60211A Contusion of right wrist, initial encounter: Secondary | ICD-10-CM | POA: Diagnosis not present

## 2017-11-19 DIAGNOSIS — Y929 Unspecified place or not applicable: Secondary | ICD-10-CM | POA: Insufficient documentation

## 2017-11-19 LAB — CBC
HCT: 39.2 % (ref 39.0–52.0)
Hemoglobin: 12.9 g/dL — ABNORMAL LOW (ref 13.0–17.0)
MCH: 29.9 pg (ref 26.0–34.0)
MCHC: 32.9 g/dL (ref 30.0–36.0)
MCV: 91 fL (ref 78.0–100.0)
Platelets: 378 10*3/uL (ref 150–400)
RBC: 4.31 MIL/uL (ref 4.22–5.81)
RDW: 14 % (ref 11.5–15.5)
WBC: 7.5 10*3/uL (ref 4.0–10.5)

## 2017-11-19 LAB — BASIC METABOLIC PANEL
ANION GAP: 7 (ref 5–15)
BUN: 5 mg/dL — ABNORMAL LOW (ref 6–20)
CALCIUM: 8.7 mg/dL — AB (ref 8.9–10.3)
CO2: 25 mmol/L (ref 22–32)
Chloride: 105 mmol/L (ref 98–111)
Creatinine, Ser: 0.88 mg/dL (ref 0.61–1.24)
GFR calc non Af Amer: >60 mL/min (ref >60)
Glucose, Bld: 98 mg/dL (ref 70–99)
Potassium: 3.6 mmol/L (ref 3.5–5.1)
SODIUM: 137 mmol/L (ref 135–145)

## 2017-11-19 LAB — CBG MONITORING, ED: GLUCOSE-CAPILLARY: 80 mg/dL (ref 70–99)

## 2017-11-19 MED ORDER — ACETAMINOPHEN 325 MG PO TABS: 650.0000 mg | ORAL_TABLET | Freq: Once | ORAL | Status: DC
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
## 2017-11-19 NOTE — ED Triage Notes (Signed)
Patient c/o dizziness and generalized weakness that started two days ago with multiple times with pain to left lower back. Patient c/o contusions and pain in right arm from trying to catch himself from falling. Denies hitting head. Denies taking any anticoagulant. Per patient blurred vision. Denies any slurred speech, facial drooping, or headache.

## 2017-11-19 NOTE — ED Notes (Signed)
PT upset after telling him the MD ordered tylenol for pain control. PT asks this nurse to remove his IV at this time. PT verbalized understanding of risks of signing out AMA. PT ambulatory down the hallway with NAD noted.

## 2018-01-24 ENCOUNTER — Encounter: Payer: Self-pay | Admitting: Urology

## 2018-01-24 ENCOUNTER — Ambulatory Visit: Payer: Medicare Other | Admitting: Urology

## 2018-02-22 DEATH — deceased

## 2018-07-23 IMAGING — CT CT ABD-PELV W/O CM
2 of 4 series · 16 of 46 positions shown, 18 images · non-contrast
Comparison: 07/06/2017 CT.

CLINICAL DATA: 42-year-old male with flank pain, nausea, vomiting
and hematuria for 3 days. History kidney stones. Initial encounter.

EXAM:
CT ABDOMEN AND PELVIS WITHOUT CONTRAST
TECHNIQUE: Multidetector CT imaging of the abdomen and pelvis was performed
following the standard protocol without IV contrast.

[Series 2: routine abd/pel wo · axial · 0.77mm/px · z∈[-1102,-672]mm · 13 of 94 slices shown, 15 images]
[im 4/94  soft-tissue]
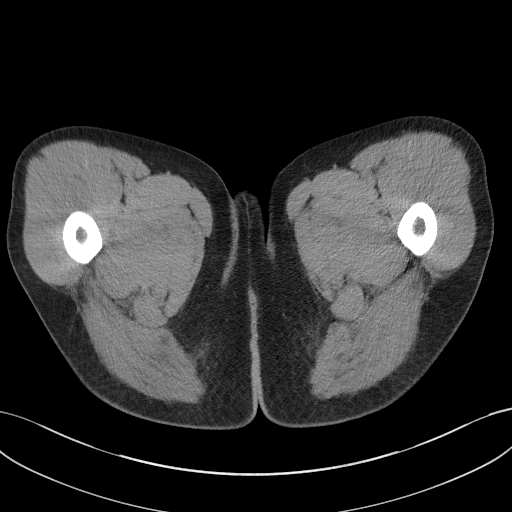
[im 4/94  bone]
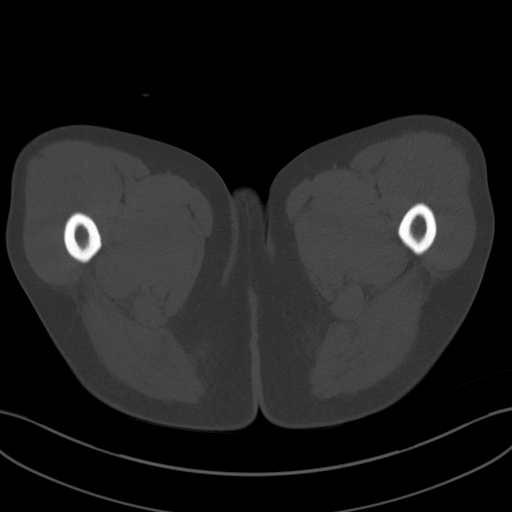
[im 12/94  soft-tissue]
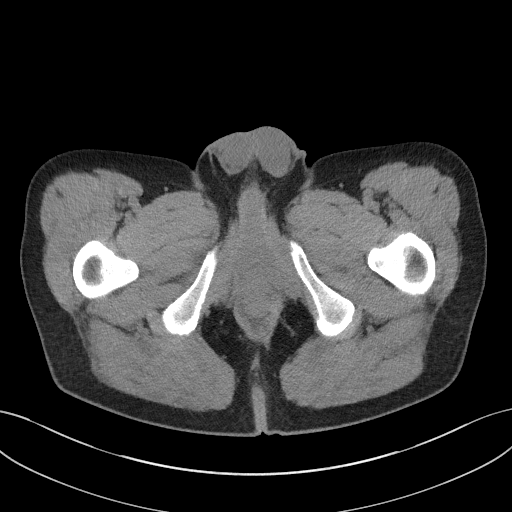
[im 20/94  soft-tissue]
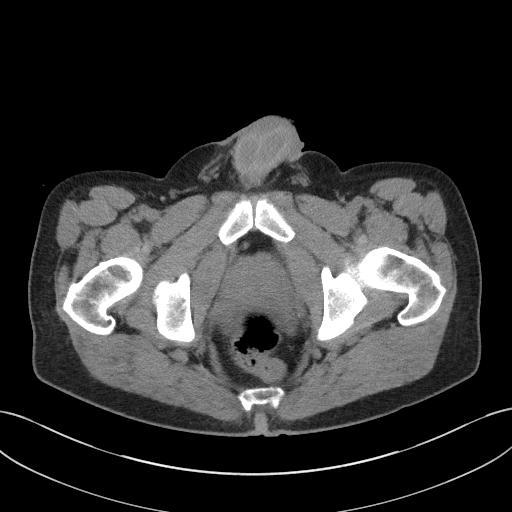
[im 28/94  soft-tissue]
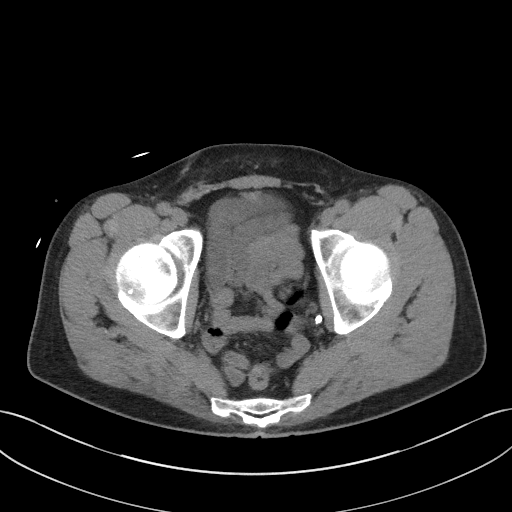
[im 32/94  soft-tissue]
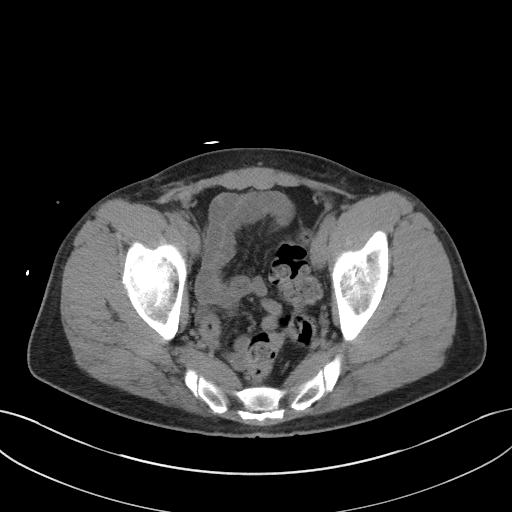
[im 39/94  soft-tissue]
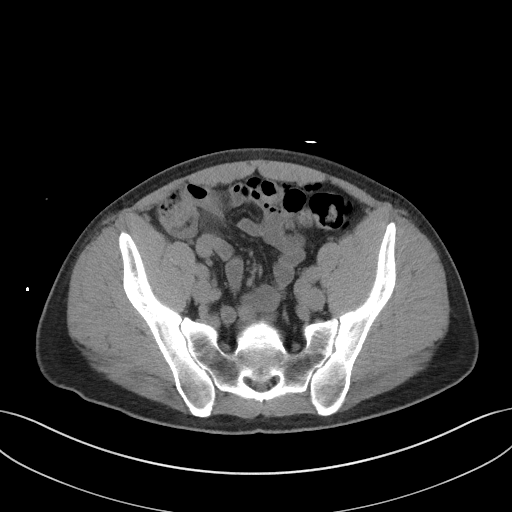
[im 47/94  soft-tissue]
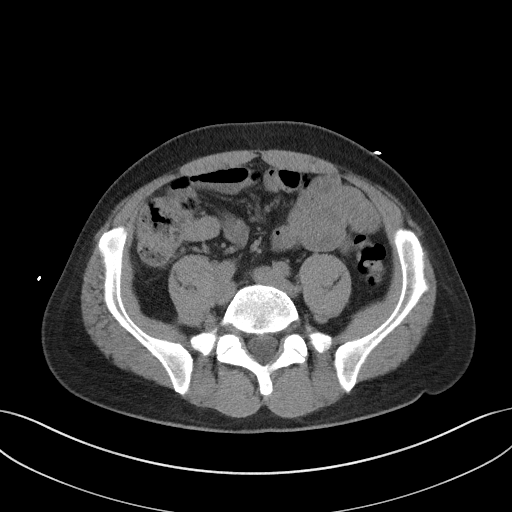
[im 55/94  soft-tissue]
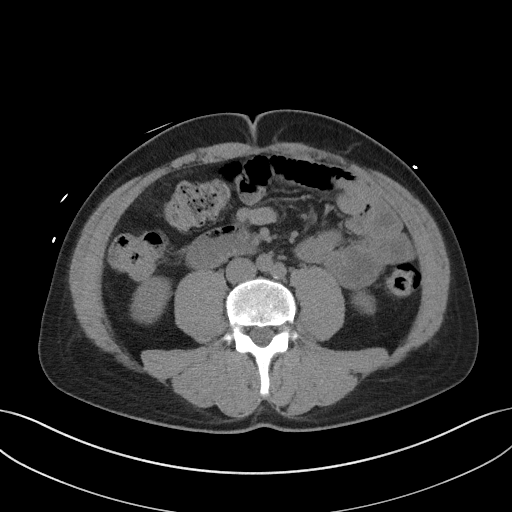
[im 63/94  soft-tissue]
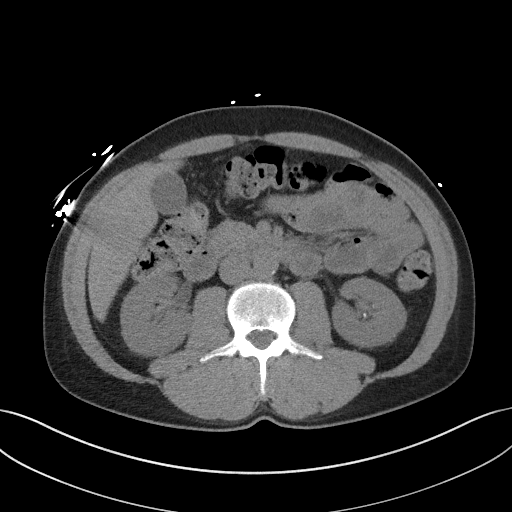
[im 63/94  bone]
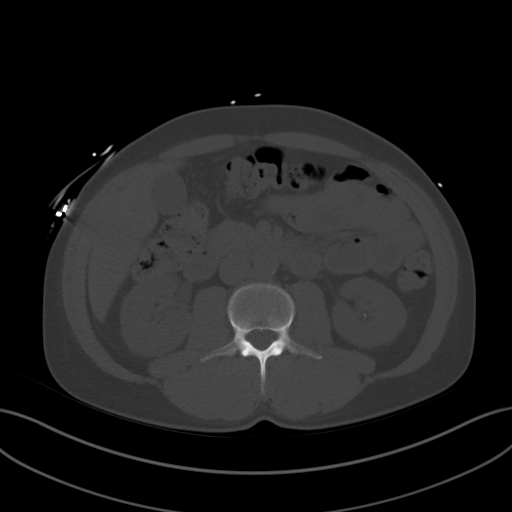
[im 66/94  soft-tissue]
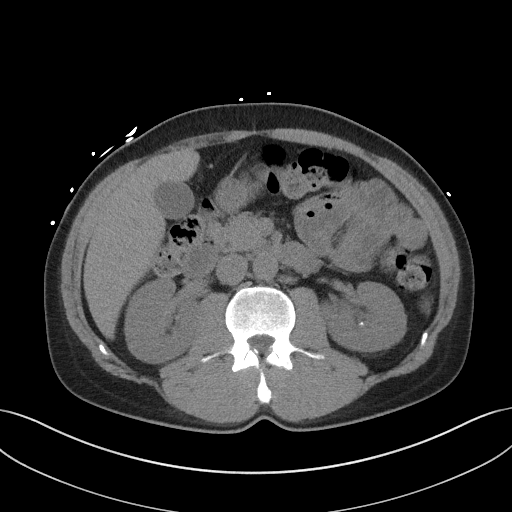
[im 74/94  soft-tissue]
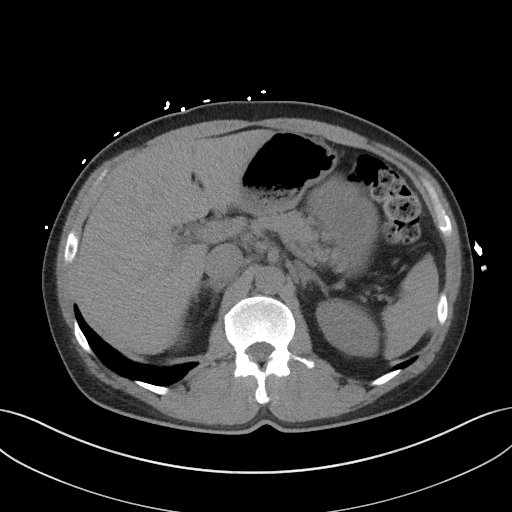
[im 82/94  soft-tissue]
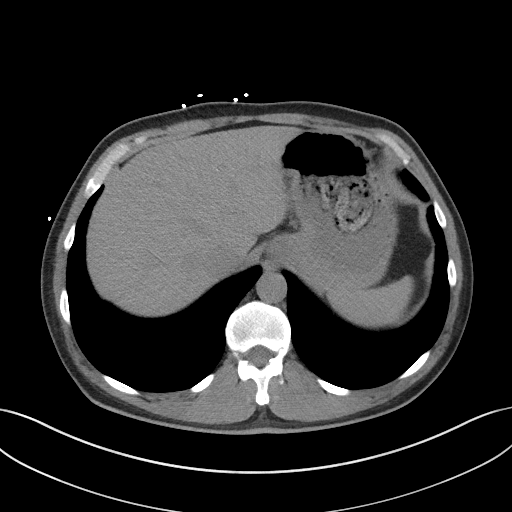
[im 90/94  soft-tissue]
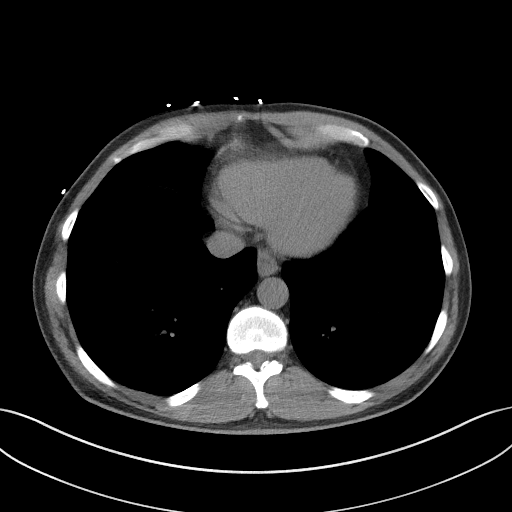

[Series 5: coronal st · coronal · 0.75mm/px · 3 of 87 slices shown]
[im 29/87  soft-tissue]
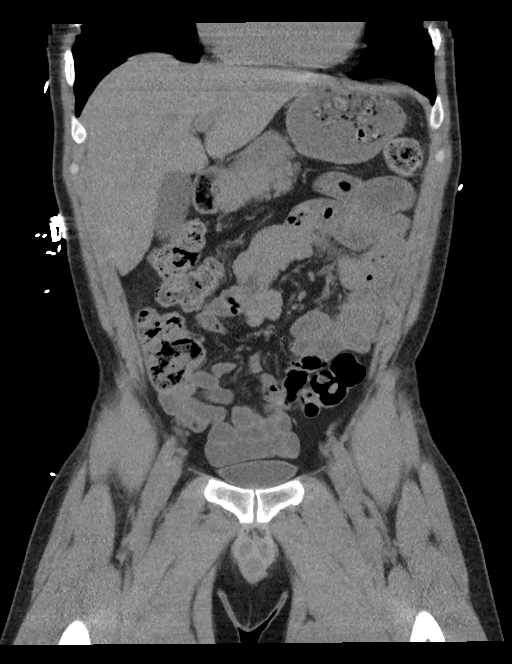
[im 39/87  soft-tissue]
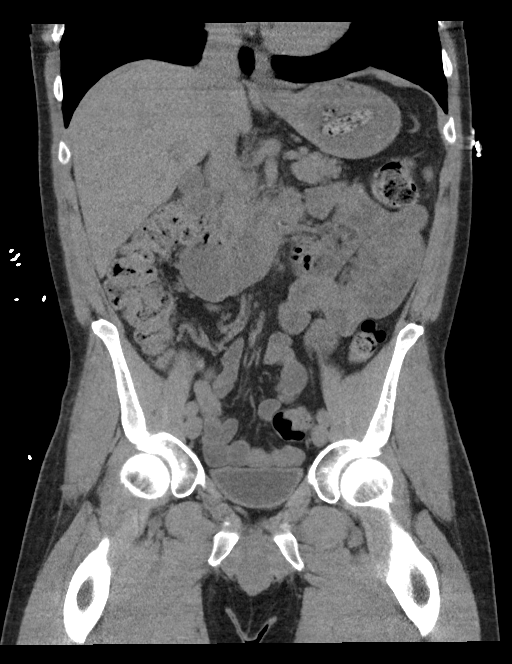
[im 48/87  soft-tissue]
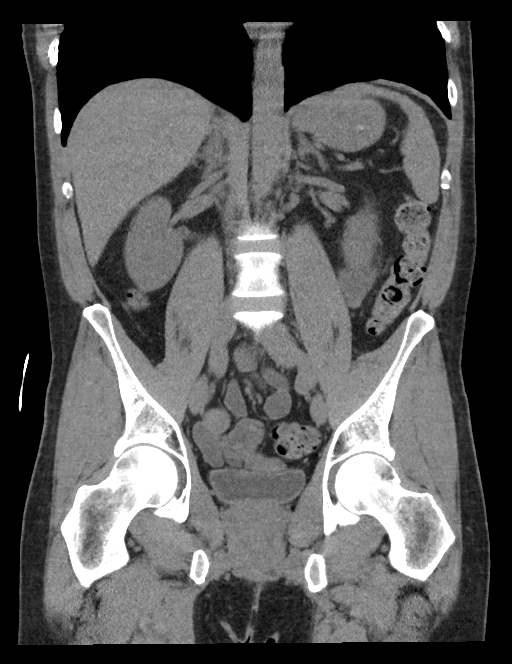

[16 of 46 positions shown; findings below may reference images not displayed]

FINDINGS: Lower chest: Lung bases clear.  Heart size within normal limits.

Hepatobiliary: Dome liver subcentimeter low-density structure
probably a cyst and unchanged. Taking into account limitation by non
contrast imaging, no worrisome hepatic lesion.

No calcified gallstone or common bile duct.

Pancreas: Taking into account limitation by non contrast imaging, no
worrisome pancreatic mass or inflammation.

Spleen: Taking into account limitation by non contrast imaging, no
splenic mass or enlargement.

Adrenals/Urinary Tract: No renal/ureteral obstructing stone or
hydronephrosis. Multiple bilateral nonobstructing renal calculi.
Right renal low-density structure probably a cyst.

No adrenal mass.

Noncontrast filled imaging of the urinary bladder unremarkable.

Stomach/Bowel: No extraluminal bowel inflammatory process.
Specifically, no inflammation surrounds the appendix.

Vascular/Lymphatic: No abdominal aortic aneurysm. Trace
calcification lower abdominal aorta/iliac arteries.

Scattered small lymph nodes without adenopathy.

Reproductive: No worrisome abnormality

Other: No free air or bowel containing hernia.

Musculoskeletal: No acute osseous abnormality.
IMPRESSION: No renal/ureteral obstructing stone or hydronephrosis. Multiple
bilateral nonobstructing renal calculi.

Aortic Atherosclerosis ([id number]).
